# Patient Record
Sex: Female | Born: 1970 | Race: Black or African American | Hispanic: No | Marital: Married | Smoking: Never smoker
Health system: Southern US, Community
[De-identification: ages and names within clinical notes are randomized; demographics above are authoritative.]

## PROBLEM LIST (undated history)

## (undated) DIAGNOSIS — D649 Anemia, unspecified: Secondary | ICD-10-CM

## (undated) DIAGNOSIS — R51 Headache: Secondary | ICD-10-CM

## (undated) DIAGNOSIS — E0500 Thyrotoxicosis with diffuse goiter without thyrotoxic crisis or storm: Secondary | ICD-10-CM

## (undated) DIAGNOSIS — T8859XA Other complications of anesthesia, initial encounter: Secondary | ICD-10-CM

## (undated) DIAGNOSIS — I639 Cerebral infarction, unspecified: Secondary | ICD-10-CM

## (undated) DIAGNOSIS — R519 Headache, unspecified: Secondary | ICD-10-CM

## (undated) DIAGNOSIS — I10 Essential (primary) hypertension: Secondary | ICD-10-CM

## (undated) DIAGNOSIS — T4145XA Adverse effect of unspecified anesthetic, initial encounter: Secondary | ICD-10-CM

## (undated) HISTORY — PX: THYROIDECTOMY: SHX17

---

## 2001-02-10 ENCOUNTER — Emergency Department (HOSPITAL_COMMUNITY): Admission: EM | Admit: 2001-02-10 | Discharge: 2001-02-11 | Payer: Self-pay | Admitting: *Deleted

## 2008-12-23 DIAGNOSIS — I639 Cerebral infarction, unspecified: Secondary | ICD-10-CM

## 2008-12-23 HISTORY — DX: Cerebral infarction, unspecified: I63.9

## 2009-10-17 ENCOUNTER — Emergency Department (HOSPITAL_COMMUNITY): Admission: EM | Admit: 2009-10-17 | Discharge: 2009-10-17 | Payer: Self-pay | Admitting: Emergency Medicine

## 2009-10-24 ENCOUNTER — Emergency Department (HOSPITAL_COMMUNITY): Admission: EM | Admit: 2009-10-24 | Discharge: 2009-10-24 | Payer: Self-pay | Admitting: Emergency Medicine

## 2010-07-11 ENCOUNTER — Emergency Department (HOSPITAL_COMMUNITY): Admission: EM | Admit: 2010-07-11 | Discharge: 2010-07-11 | Payer: Self-pay | Admitting: Emergency Medicine

## 2011-03-09 LAB — POCT I-STAT, CHEM 8
BUN: 12 mg/dL (ref 6–23)
Calcium, Ion: 1.09 mmol/L — ABNORMAL LOW (ref 1.12–1.32)
Creatinine, Ser: 0.8 mg/dL (ref 0.4–1.2)
Hemoglobin: 13.3 g/dL (ref 12.0–15.0)
Sodium: 139 mEq/L (ref 135–145)
TCO2: 25 mmol/L (ref 0–100)

## 2012-01-20 ENCOUNTER — Other Ambulatory Visit (HOSPITAL_COMMUNITY): Payer: Self-pay | Admitting: Internal Medicine

## 2012-01-20 DIAGNOSIS — G8929 Other chronic pain: Secondary | ICD-10-CM

## 2012-01-21 ENCOUNTER — Ambulatory Visit (HOSPITAL_COMMUNITY)
Admission: RE | Admit: 2012-01-21 | Discharge: 2012-01-21 | Disposition: A | Discharge status: Home / Self Care | Payer: Self-pay | Source: Ambulatory Visit | Attending: Internal Medicine | Admitting: Internal Medicine

## 2012-01-21 ENCOUNTER — Encounter (HOSPITAL_COMMUNITY): Payer: Self-pay

## 2012-01-21 DIAGNOSIS — J3489 Other specified disorders of nose and nasal sinuses: Secondary | ICD-10-CM | POA: Insufficient documentation

## 2012-01-21 DIAGNOSIS — G8929 Other chronic pain: Secondary | ICD-10-CM

## 2012-01-21 DIAGNOSIS — R51 Headache: Secondary | ICD-10-CM | POA: Insufficient documentation

## 2012-01-21 DIAGNOSIS — H538 Other visual disturbances: Secondary | ICD-10-CM | POA: Insufficient documentation

## 2012-01-21 DIAGNOSIS — R42 Dizziness and giddiness: Secondary | ICD-10-CM | POA: Insufficient documentation

## 2012-01-21 HISTORY — DX: Essential (primary) hypertension: I10

## 2012-01-21 MED ORDER — IOHEXOL 300 MG/ML  SOLN
100.0000 mL | Freq: Once | INTRAMUSCULAR | Status: AC | PRN
  Administered 2012-01-21: 100 mL via INTRAVENOUS

## 2012-02-19 ENCOUNTER — Other Ambulatory Visit (HOSPITAL_COMMUNITY): Payer: Self-pay | Admitting: Internal Medicine

## 2012-02-19 DIAGNOSIS — R519 Headache, unspecified: Secondary | ICD-10-CM

## 2012-02-26 ENCOUNTER — Ambulatory Visit (HOSPITAL_COMMUNITY)
Admission: RE | Admit: 2012-02-26 | Discharge: 2012-02-26 | Disposition: A | Discharge status: Home / Self Care | Payer: Self-pay | Source: Ambulatory Visit | Attending: Internal Medicine | Admitting: Internal Medicine

## 2012-02-26 DIAGNOSIS — Z8673 Personal history of transient ischemic attack (TIA), and cerebral infarction without residual deficits: Secondary | ICD-10-CM | POA: Insufficient documentation

## 2012-02-26 DIAGNOSIS — R51 Headache: Secondary | ICD-10-CM | POA: Insufficient documentation

## 2012-02-26 DIAGNOSIS — R519 Headache, unspecified: Secondary | ICD-10-CM

## 2012-04-17 ENCOUNTER — Other Ambulatory Visit (HOSPITAL_COMMUNITY): Payer: Self-pay | Admitting: Neurology

## 2012-04-17 DIAGNOSIS — R51 Headache: Secondary | ICD-10-CM

## 2012-04-17 DIAGNOSIS — I679 Cerebrovascular disease, unspecified: Secondary | ICD-10-CM

## 2012-04-17 DIAGNOSIS — I7771 Dissection of carotid artery: Secondary | ICD-10-CM

## 2012-04-20 ENCOUNTER — Ambulatory Visit (HOSPITAL_COMMUNITY): Payer: Self-pay

## 2012-04-24 ENCOUNTER — Other Ambulatory Visit (HOSPITAL_COMMUNITY): Payer: Self-pay | Admitting: Interventional Radiology

## 2012-04-24 ENCOUNTER — Ambulatory Visit (HOSPITAL_COMMUNITY)
Admission: RE | Admit: 2012-04-24 | Discharge: 2012-04-24 | Disposition: A | Discharge status: Home / Self Care | Payer: Self-pay | Source: Ambulatory Visit | Attending: Neurology | Admitting: Neurology

## 2012-04-24 DIAGNOSIS — R51 Headache: Secondary | ICD-10-CM

## 2012-04-24 DIAGNOSIS — R918 Other nonspecific abnormal finding of lung field: Secondary | ICD-10-CM

## 2012-04-24 DIAGNOSIS — G8929 Other chronic pain: Secondary | ICD-10-CM

## 2012-04-24 DIAGNOSIS — I679 Cerebrovascular disease, unspecified: Secondary | ICD-10-CM

## 2012-04-24 DIAGNOSIS — I7771 Dissection of carotid artery: Secondary | ICD-10-CM

## 2012-05-01 ENCOUNTER — Other Ambulatory Visit: Payer: Self-pay | Admitting: Radiology

## 2012-05-08 ENCOUNTER — Other Ambulatory Visit (HOSPITAL_COMMUNITY): Payer: Self-pay | Admitting: Interventional Radiology

## 2012-05-08 ENCOUNTER — Ambulatory Visit (HOSPITAL_COMMUNITY)
Admission: RE | Admit: 2012-05-08 | Discharge: 2012-05-08 | Disposition: A | Discharge status: Home / Self Care | Payer: Self-pay | Source: Ambulatory Visit | Attending: Interventional Radiology | Admitting: Interventional Radiology

## 2012-05-08 ENCOUNTER — Encounter (HOSPITAL_COMMUNITY): Payer: Self-pay

## 2012-05-08 DIAGNOSIS — G8929 Other chronic pain: Secondary | ICD-10-CM

## 2012-05-08 DIAGNOSIS — R918 Other nonspecific abnormal finding of lung field: Secondary | ICD-10-CM

## 2012-05-08 DIAGNOSIS — I6529 Occlusion and stenosis of unspecified carotid artery: Secondary | ICD-10-CM | POA: Insufficient documentation

## 2012-05-08 DIAGNOSIS — Z8673 Personal history of transient ischemic attack (TIA), and cerebral infarction without residual deficits: Secondary | ICD-10-CM | POA: Insufficient documentation

## 2012-05-08 DIAGNOSIS — R51 Headache: Secondary | ICD-10-CM | POA: Insufficient documentation

## 2012-05-08 HISTORY — DX: Thyrotoxicosis with diffuse goiter without thyrotoxic crisis or storm: E05.00

## 2012-05-08 LAB — APTT: aPTT: 30 seconds (ref 24–37)

## 2012-05-08 LAB — PROTIME-INR: INR: 0.87 (ref 0.00–1.49)

## 2012-05-08 LAB — CBC
HCT: 32 % — ABNORMAL LOW (ref 36.0–46.0)
Hemoglobin: 10.2 g/dL — ABNORMAL LOW (ref 12.0–15.0)
MCHC: 31.9 g/dL (ref 30.0–36.0)
RDW: 18.4 % — ABNORMAL HIGH (ref 11.5–15.5)
WBC: 6.8 10*3/uL (ref 4.0–10.5)

## 2012-05-08 LAB — BASIC METABOLIC PANEL
BUN: 13 mg/dL (ref 6–23)
Chloride: 100 mEq/L (ref 96–112)
Creatinine, Ser: 0.79 mg/dL (ref 0.50–1.10)
GFR calc Af Amer: >90 mL/min (ref >90)
GFR calc non Af Amer: >90 mL/min (ref >90)
Potassium: 5.1 mEq/L (ref 3.5–5.1)

## 2012-05-08 LAB — DIFFERENTIAL
Basophils Absolute: 0.1 10*3/uL (ref 0.0–0.1)
Basophils Relative: 1 % (ref 0–1)
Lymphocytes Relative: 35 % (ref 12–46)
Monocytes Absolute: 0.5 10*3/uL (ref 0.1–1.0)
Neutro Abs: 3.7 10*3/uL (ref 1.7–7.7)
Neutrophils Relative %: 55 % (ref 43–77)

## 2012-05-08 MED ORDER — HEPARIN SOD (PORK) LOCK FLUSH 100 UNIT/ML IV SOLN
INTRAVENOUS | Status: AC | PRN
Start: 2012-05-08 — End: 2012-05-08
  Administered 2012-05-08 (×2): 500 [IU] via INTRAVENOUS

## 2012-05-08 MED ORDER — FENTANYL CITRATE 0.05 MG/ML IJ SOLN
INTRAMUSCULAR | Status: AC | PRN
  Administered 2012-05-08: 25 ug via INTRAVENOUS

## 2012-05-08 MED ORDER — IOHEXOL 300 MG/ML  SOLN
100.0000 mL | Freq: Once | INTRAMUSCULAR | Status: AC | PRN
  Administered 2012-05-08: 70 mL via INTRA_ARTERIAL

## 2012-05-08 MED ORDER — FENTANYL CITRATE 0.05 MG/ML IJ SOLN
INTRAMUSCULAR | Status: AC
  Filled 2012-05-08: qty 4

## 2012-05-08 MED ORDER — HYDRALAZINE HCL 20 MG/ML IJ SOLN
INTRAMUSCULAR | Status: AC | PRN
  Administered 2012-05-08: 5 mg via INTRAVENOUS

## 2012-05-08 MED ORDER — SODIUM CHLORIDE 0.9 % IV SOLN
Freq: Once | INTRAVENOUS | Status: AC
  Administered 2012-05-08: 08:00:00 via INTRAVENOUS

## 2012-05-08 MED ORDER — SODIUM CHLORIDE 0.9 % IV SOLN: INTRAVENOUS | Status: AC

## 2012-05-08 MED ORDER — MIDAZOLAM HCL 5 MG/5ML IJ SOLN
INTRAMUSCULAR | Status: AC | PRN
  Administered 2012-05-08: 1 mg via INTRAVENOUS

## 2012-05-08 MED ORDER — MIDAZOLAM HCL 2 MG/2ML IJ SOLN
INTRAMUSCULAR | Status: AC
  Filled 2012-05-08: qty 6

## 2012-05-08 MED ORDER — HYDRALAZINE HCL 20 MG/ML IJ SOLN
INTRAMUSCULAR | Status: AC
  Filled 2012-05-08: qty 1

## 2012-05-08 NOTE — Procedures (Signed)
S/P 4 vessel cerebral arteriogram RT CFA approach. Preliminary findings. 1. Bilaerally occuded  ICAs with collateralization from both VAs via the the Pcoms.

## 2012-05-08 NOTE — H&P (Signed)
Chief Complaint: Headaches, possible carotid stenosis HPI: Holly Hamilton is an 41 y.o. female who has been seen by Dr. Corliss Skains fro abnormal MRA findings and is scheduled for formal 4-vessel cerebral arteriogram.   Past Medical History:  Past Medical History  Diagnosis Date  . Hypertension   . Grave's disease     Past Surgical History:  Past Surgical History  Procedure Date  . Thyroidectomy     Family History: History reviewed. No pertinent family history.  Social History:  reports that she has never smoked. She does not have any smokeless tobacco history on file. She reports that she does not drink alcohol or use illicit drugs.  Allergies: No Known Allergies  Medications: Synthroid 150 mcg 1 tablet daily,  lisinopril/hydrochlorothiazide 10/12.5 mg 1 tablet daily, Percocet  10/325 mg 1-2 tablets every 4-6 hours as needed, aspirin 325 mg  daily.   Please HPI for pertinent positives, otherwise complete 10 system ROS negative.  Physical Exam: Blood pressure 123/83, pulse 86, temperature 97.9 F (36.6 C), temperature source Oral, resp. rate 16, height 5\' 4"  (1.626 m), weight 188 lb (85.276 kg), SpO2 100.00%. Body mass index is 32.27 kg/(m^2).   General Appearance:  Alert, cooperative, no distress, appears stated age  Head:  Normocephalic, without obvious abnormality, atraumatic  ENT: Unremarkable  Neck: Supple, symmetrical, trachea midline, no adenopathy, thyroid: not enlarged, symmetric, no tenderness/mass/nodules  Lungs:   Clear to auscultation bilaterally, no w/r/r, respirations unlabored without use of accessory muscles.  Chest Wall:  No tenderness or deformity  Heart:  Regular rate and rhythm, S1, S2 normal, no murmur, rub or gallop. Carotids 2+ without bruit.  Abdomen:   Soft, non-tender, non distended. Bowel sounds active all four quadrants,  no masses, no organomegaly.  Extremities: Extremities normal, atraumatic, no cyanosis or edema  Pulses: 2+ and symmetric  Skin:  Skin color, texture, turgor normal, no rashes or lesions  Neurologic: Normal affect, no gross deficits.   Results for orders placed during the hospital encounter of 05/08/12 (from the past 48 hour(s))  APTT     Status: Normal   Collection Time   05/08/12  8:01 AM      Component Value Range Comment   aPTT 30  24 - 37 (seconds)   CBC     Status: Abnormal   Collection Time   05/08/12  8:01 AM      Component Value Range Comment   WBC 6.8  4.0 - 10.5 (K/uL)    RBC 4.42  3.87 - 5.11 (MIL/uL)    Hemoglobin 10.2 (*) 12.0 - 15.0 (g/dL)    HCT 45.4 (*) 09.8 - 46.0 (%)    MCV 72.4 (*) 78.0 - 100.0 (fL)    MCH 23.1 (*) 26.0 - 34.0 (pg)    MCHC 31.9  30.0 - 36.0 (g/dL)    RDW 11.9 (*) 14.7 - 15.5 (%)    Platelets 310  150 - 400 (K/uL)   DIFFERENTIAL     Status: Normal   Collection Time   05/08/12  8:01 AM      Component Value Range Comment   Neutrophils Relative 55  43 - 77 (%)    Neutro Abs 3.7  1.7 - 7.7 (K/uL)    Lymphocytes Relative 35  12 - 46 (%)    Lymphs Abs 2.4  0.7 - 4.0 (K/uL)    Monocytes Relative 8  3 - 12 (%)    Monocytes Absolute 0.5  0.1 - 1.0 (K/uL)    Eosinophils  Relative 1  0 - 5 (%)    Eosinophils Absolute 0.1  0.0 - 0.7 (K/uL)    Basophils Relative 1  0 - 1 (%)    Basophils Absolute 0.1  0.0 - 0.1 (K/uL)   PROTIME-INR     Status: Normal   Collection Time   05/08/12  8:01 AM      Component Value Range Comment   Prothrombin Time 12.0  11.6 - 15.2 (seconds)    INR 0.87  0.00 - 1.49    BASIC METABOLIC PANEL     Status: Normal (Preliminary result)   Collection Time   05/08/12  8:10 AM      Component Value Range Comment   Sodium 135  135 - 145 (mEq/L)    Potassium PENDING  3.5 - 5.1 (mEq/L)    Chloride 100  96 - 112 (mEq/L)    CO2 24  19 - 32 (mEq/L)    Glucose, Bld 89  70 - 99 (mg/dL)    BUN 13  6 - 23 (mg/dL)    Creatinine, Ser 2.84  0.50 - 1.10 (mg/dL)    Calcium 9.4  8.4 - 10.5 (mg/dL)    GFR calc non Af Amer >90  >90 (mL/min)    GFR calc Af Amer >90  >90  (mL/min)    No results found.  Assessment/Plan Headaches, abnormal MRA findings. Proceed with cerebral angio today. Procedure reviewed including risks. Labs ok. Consent signed in chart.  Brayton El PA-C 05/08/2012, 9:08 AM

## 2012-05-08 NOTE — Progress Notes (Signed)
Removed right groin dressing and placed bandaid to site.  Site WNL.  Orthostatic vs done and pt tolerated well.  Ambulated pt in hallway and pt tolerated well.  Reviewed D/C instructions with pt and family and both verbalized understanding.  Pt D/C home with family via wheelchair.

## 2012-05-08 NOTE — Discharge Instructions (Signed)

## 2012-05-11 ENCOUNTER — Telehealth (HOSPITAL_COMMUNITY): Payer: Self-pay | Admitting: *Deleted

## 2012-05-11 NOTE — Telephone Encounter (Signed)
Post op phone call, right groin a little sore but no signs of hematoma or bruising per pt.  Feels good.  Encouraged to call for any problems or questions.

## 2012-07-21 ENCOUNTER — Telehealth (HOSPITAL_COMMUNITY): Payer: Self-pay

## 2012-07-21 NOTE — Telephone Encounter (Signed)
I left a vm for Holly Hamilton in regards to calling the office to schedule her f/u

## 2014-02-04 IMAGING — XA IR ANGIO INTRA EXTRACRAN SEL COM CAROTID INNOMINATE BILAT MOD SE
1 series · 12 of 24 positions shown · IV contrast (IODINE)
Comparison: MRI of the brain of 02/26/2012.

CLINICAL DATA: Patient with recurrent headaches.  Previous history
of right cerebral hemispheric ischemic strokes.  Abnormal MRI of
the brain and MRA of the brain.

BILATERAL COMMON CAROTID ARTERY AND BILATERAL VERTEBRAL ARTERY
ANGIOGRAMS

[Series 300: neuro · 12 of 201 slices shown]
[im 9/201]
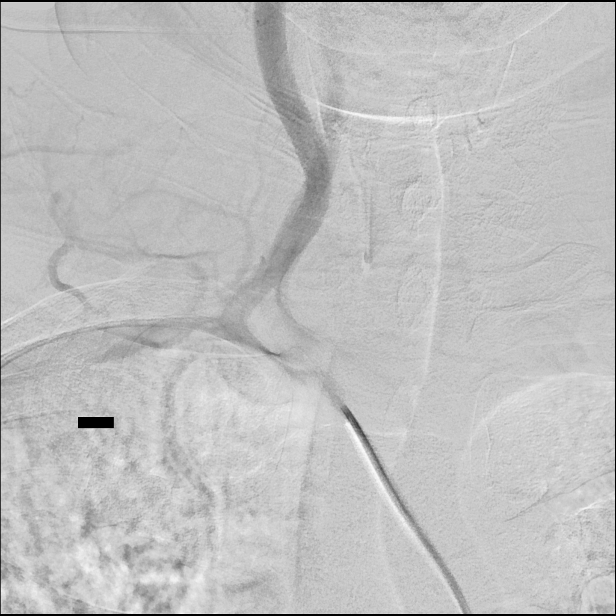
[im 27/201]
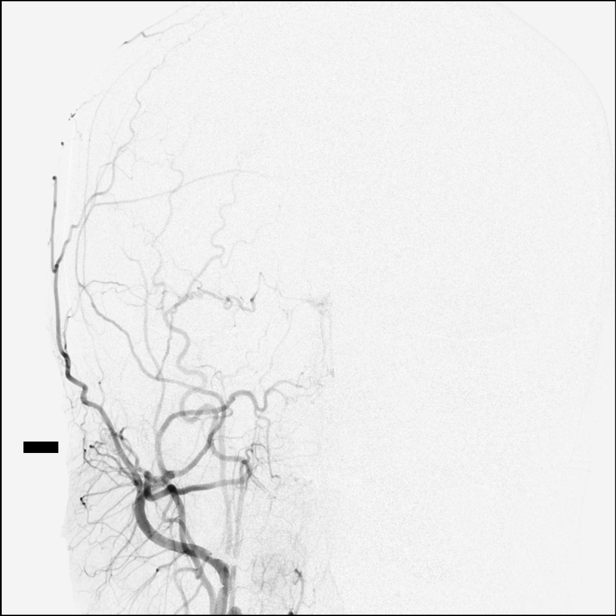
[im 44/201]
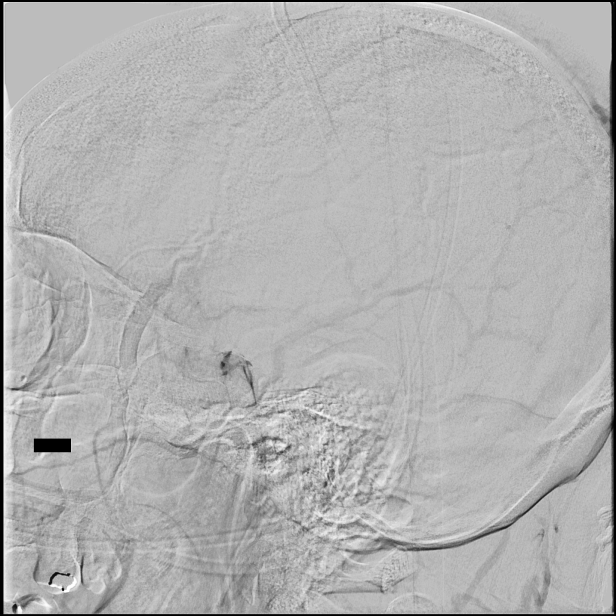
[im 61/201]
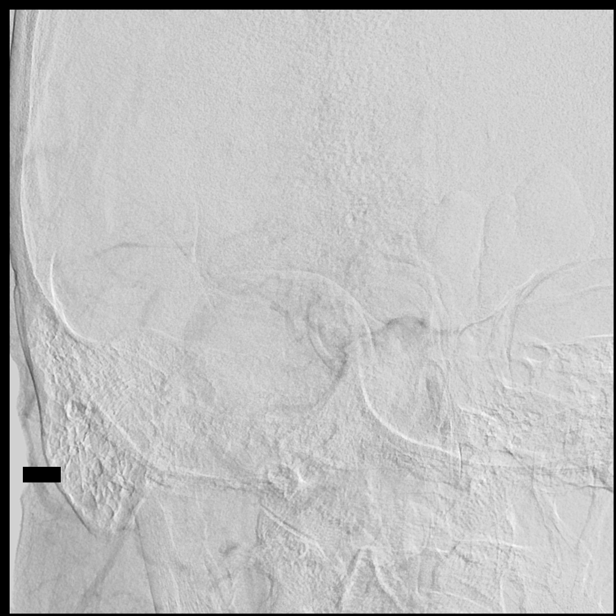
[im 79/201]
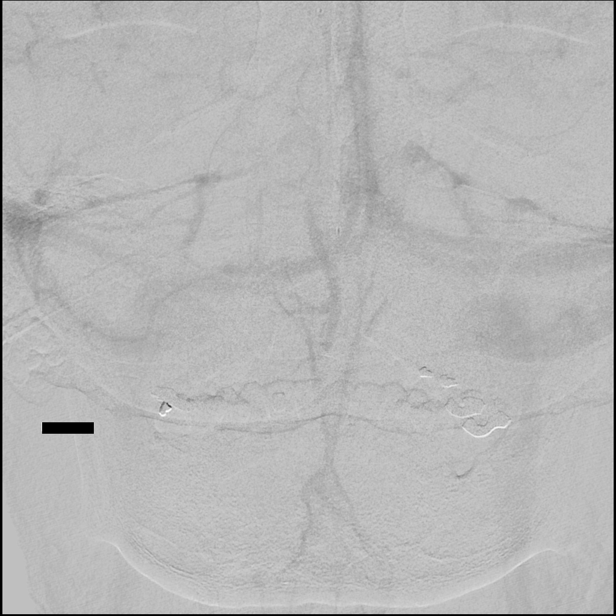
[im 96/201]
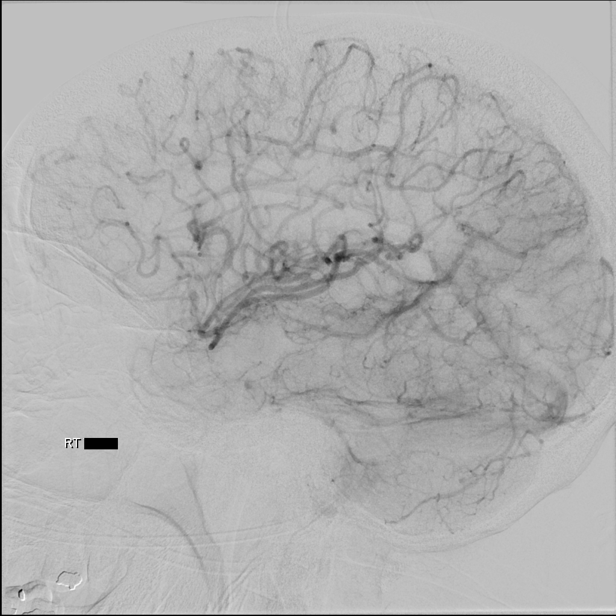
[im 114/201]
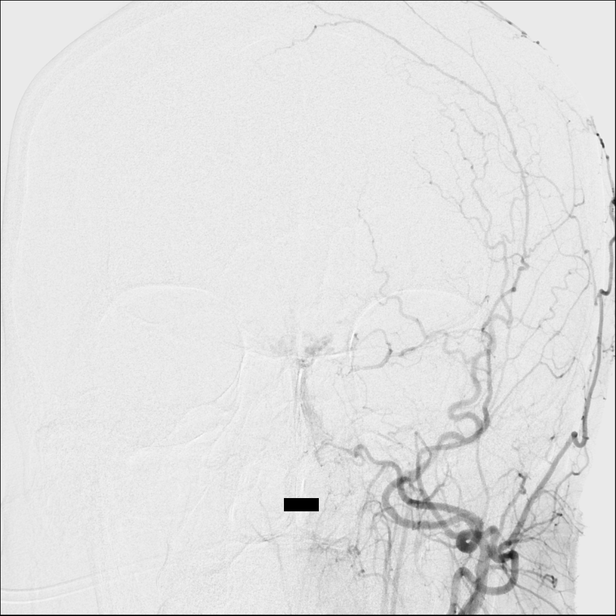
[im 131/201]
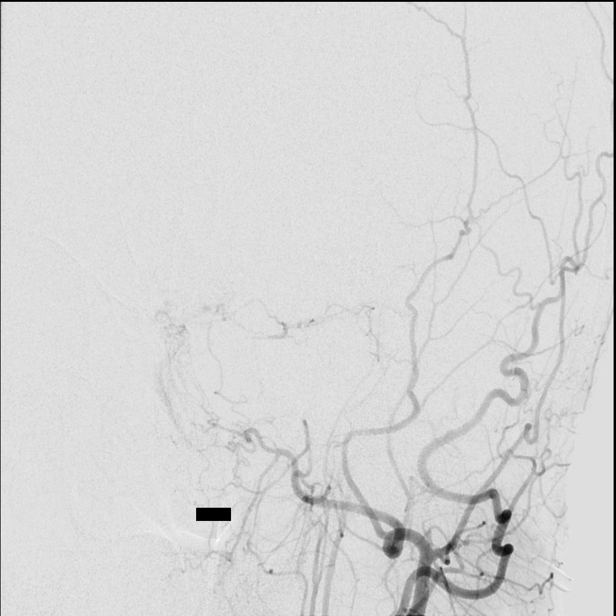
[im 148/201]
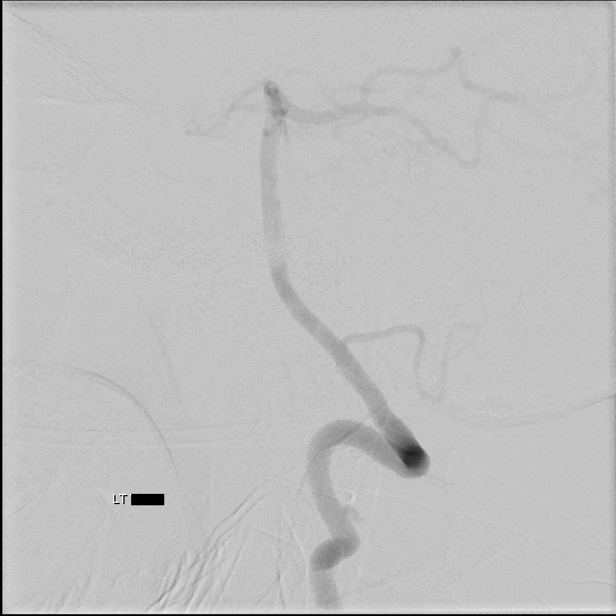
[im 166/201]
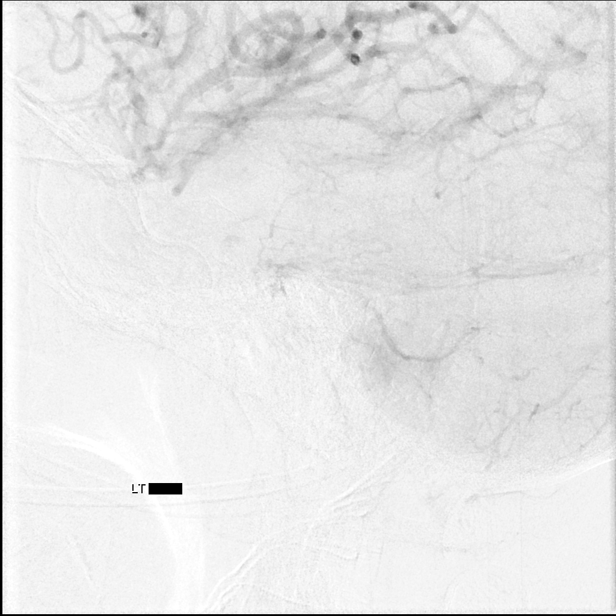
[im 183/201]
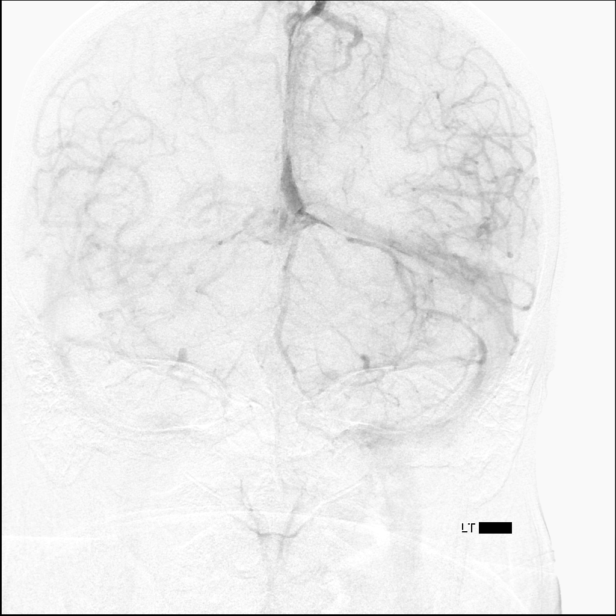
[im 201/201]
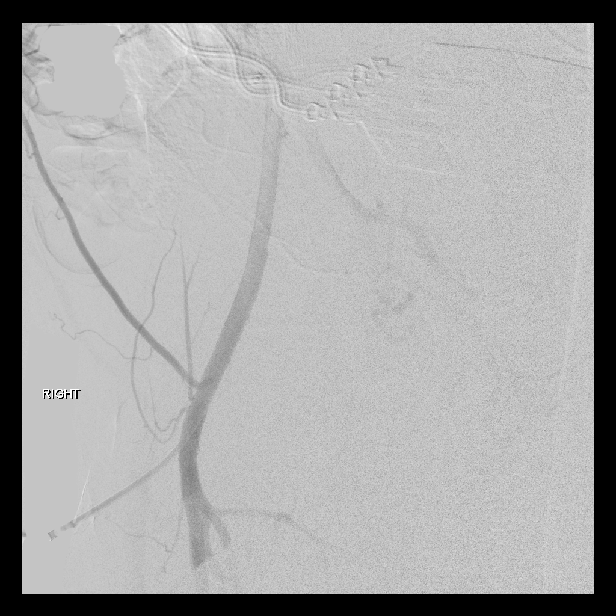

[12 of 24 positions shown; findings below may reference images not displayed]

Following a full explanation of the procedure along with the
potential associated complications, an informed witnessed consent
was obtained.

The right groin was prepped and draped in the usual sterile
fashion.  Thereafter using a modified Seldinger technique,
transfemoral access into the right common femoral artery was
obtained without difficulty.

Over a 0.035-inch guidewire, a 5-French Pinnacle sheath was
inserted.  Through this and also over a 0.035-inch guidewire, a 5-
French JB1 catheter was advanced to the aortic arch region and
selectively positioned in the right common carotid artery, the
right vertebral artery, the left common carotid artery and the left
vertebral artery.

There were no acute complications. The patient tolerated the
procedure well.

Medications utilized: Versed 1 mg IV.  Fentanyl 25 mg IV.

Contrast: Dmnipaque-V22 approximately 55 ml.
FINDINGS: The right common carotid arteriogram demonstrates the right
external carotid artery and its major branches to be normal.

The right internal carotid artery at the bulb is patent.  The
vessel distal to this is diffusely significantly decreased in
caliber to the cavernous segment where it demonstrates patchy
opacification to the level of the ophthalmic artery.

A pituitary blush is seen.

There is no flow noted within the distal cavernous carotid or
supraclinoid right ICA.

Extensive nasoseptal collaterals are seen opacifying retrogradely
the ophthalmic artery.

The right vertebral artery origin is normal.  The vessel opacifies
normally to the cranial skull base.  There is normal opacification
of the right posterior-inferior cerebral artery.

The basilar artery, the posterior cerebral arteries, the superior
cerebellar arteries and the anterior-inferior cerebellar arteries
opacify normally into the capillary and venous phases.

Extensive leptomeningeal collateralization from both the P3
segments of the posterior cerebral arteries, and retrogradely via
the posterior communicating arteries are seen to supply the
parietal, the posterior frontal and then the anterior frontal
regions in a retrograde manner.

Retrograde opacification of the middle cerebral arteries to their
sites of occlusion in the distal M1 segment is noted.

The left common carotid arteriogram demonstrates the left external
carotid artery and its major branches to be normal.

The left internal carotid artery just distal to the bulb shows
diffuse decreased caliber to the level of the cavernous segment
where there is complete occlusion.

Extensive retrograde opacification of the ipsilateral ophthalmic
artery is seen from the nasoseptal branches of the external carotid
artery. The supraclinoid segment remains occluded.  No intracranial
flow is demonstrated from the carotid system.

The left vertebral artery origin is normal.  The vessel opacifies
normally to the cranial skull base.

There is normal opacification of the left posterior-inferior
cerebellar artery, and the left vertebrobasilar junction.

The basilar artery, the posterior cerebral arteries, superior
cerebellar arteries and the anterior-inferior cerebellar arteries
opacify normally into the capillary and venous phases.

Again demonstrated is retrograde opacification via the anterior
communicating arteries of the anterior cerebrals and the middle
cerebral artery distributions.

Also noted are extensive leptomeningeal collaterals from the P3
segments of the posterior cerebral arteries with retrograde flow
into the parietal, anterior parietal and the frontal cortical
regions and then subsequently into the subcortical regions and also
the MCA trifurcation regions bilaterally.

IMPRESSION
1.  Angiographically complete occlusion of the internal carotid
arteries in the cavernous segments.
2.  Angiographic extensive collateralization via the posterior
communicating arteries, and via the leptomeningeal collaterals from
the posterior cerebral arteries bilaterally supplying the anterior
and middle cerebral artery distributions bilaterally as described
above.

The angiographic findings were reviewed with the patient and the
patient's family.  The patient was strongly advised to stop
smoking, refrain from use of vasoconstrictor agents such as cocaine
or marijuana.  She has also been strongly advised to continue
taking her antihypertensive medications.  A follow-up CT perfusion
scan will be performed in 3 months from today.

## 2017-01-22 ENCOUNTER — Emergency Department (HOSPITAL_COMMUNITY)
Admission: EM | Admit: 2017-01-22 | Discharge: 2017-01-22 | Disposition: A | Discharge status: Home / Self Care | Payer: Self-pay | Attending: Emergency Medicine | Admitting: Emergency Medicine

## 2017-01-22 ENCOUNTER — Encounter (HOSPITAL_COMMUNITY): Payer: Self-pay | Admitting: Emergency Medicine

## 2017-01-22 DIAGNOSIS — R197 Diarrhea, unspecified: Secondary | ICD-10-CM | POA: Insufficient documentation

## 2017-01-22 DIAGNOSIS — J029 Acute pharyngitis, unspecified: Secondary | ICD-10-CM | POA: Insufficient documentation

## 2017-01-22 DIAGNOSIS — R6889 Other general symptoms and signs: Secondary | ICD-10-CM

## 2017-01-22 DIAGNOSIS — Z7982 Long term (current) use of aspirin: Secondary | ICD-10-CM | POA: Insufficient documentation

## 2017-01-22 DIAGNOSIS — Z79899 Other long term (current) drug therapy: Secondary | ICD-10-CM | POA: Insufficient documentation

## 2017-01-22 DIAGNOSIS — I10 Essential (primary) hypertension: Secondary | ICD-10-CM | POA: Insufficient documentation

## 2017-01-22 MED ORDER — OXYMETAZOLINE HCL 0.05 % NA SOLN
1.0000 | Freq: Two times a day (BID) | NASAL | 0 refills | Status: DC
Start: 2017-01-22 — End: 2017-06-20

## 2017-01-22 MED ORDER — BENZONATATE 100 MG PO CAPS: 200.0000 mg | ORAL_CAPSULE | Freq: Two times a day (BID) | ORAL | 0 refills | Status: DC | PRN

## 2017-01-22 NOTE — ED Notes (Signed)
See PA note for secondary assessment.   

## 2017-01-22 NOTE — ED Triage Notes (Signed)
Has felt hot cold and has had a cough  For a couple of days  Felt lightheaded at work

## 2017-01-22 NOTE — ED Provider Notes (Signed)
[facility] DEPT Provider Note   CSN: [id number] Arrival date & time: 01/22/17  1344  By signing my name below, I, Higinio Plan, attest that this documentation has been prepared under the direction and in the presence of Harlene Ramus, PA-C . Electronically Signed: Higinio Plan, Scribe. 01/22/2017. 2:19 PM.  History   Chief Complaint Chief Complaint  Patient presents with  . Chills  . Cough   The history is provided by the patient. No language interpreter was used.   HPI Comments: Holly Hamilton is a 46 y.o. female with PMHx of HTN, hyperthyroidism, and Grave's Disease, who presents to the Emergency Department complaining of gradually worsening, sore throat and yellow mucous that began ~4 days ago. Pt reports associated chills, subjective fever, headache which she describes as sinus pressure, congestion, rhinorrhea, lightheadedness, shortness of breath, and loose stools. She notes her husband had similar symptoms including nausea and vomiting last week that he "treated by himself." She states she did not receive the influenza vaccination this year. Pt denies chest pain, wheezing, abdominal pain, vomiting, blood in her stool, wheezing, rash and dysuria.  Past Medical History:  Diagnosis Date  . Grave's disease   . Hypertension    There are no active problems to display for this patient.  Past Surgical History:  Procedure Laterality Date  . THYROIDECTOMY      OB History    No data available     Home Medications    Prior to Admission medications   Medication Sig Start Date End Date Taking? Authorizing Provider  aspirin EC 325 MG tablet Take 325 mg by mouth daily.    Historical Provider, MD  aspirin-acetaminophen-caffeine (EXCEDRIN MIGRAINE) [phone] MG per tablet Take 1 tablet by mouth every 6 (six) hours as needed. For migraines    Historical Provider, MD  benzonatate (TESSALON) 100 MG capsule Take 2 capsules (200 mg total) by mouth 2 (two) times daily as needed for cough.  01/22/17   Nona Dell, PA-C  Cholecalciferol (VITAMIN D PO) Take by mouth.    Historical Provider, MD  levothyroxine (SYNTHROID, LEVOTHROID) 150 MCG tablet Take 150 mcg by mouth daily.    Historical Provider, MD  lisinopril-hydrochlorothiazide (PRINZIDE,ZESTORETIC) 10-12.5 MG per tablet Take 1 tablet by mouth daily.    Historical Provider, MD  oxyCODONE-acetaminophen (PERCOCET) 10-325 MG per tablet Take 1 tablet by mouth every 4 (four) hours as needed. For pain    Historical Provider, MD  oxymetazoline (AFRIN NASAL SPRAY) 0.05 % nasal spray Place 1 spray into both nostrils 2 (two) times daily. Spray once into each nostril twice daily for up to the next 3 days. Do not use for more than 3 days to prevent rebound rhinorrhea. 01/22/17   Nona Dell, PA-C    Family History No family history on file.  Social History Social History  Substance Use Topics  . Smoking status: Never Smoker  . Smokeless tobacco: Not on file  . Alcohol use No     Allergies   Patient has no known allergies.   Review of Systems Review of Systems  Constitutional: Positive for chills and fever.  HENT: Positive for congestion, rhinorrhea, sinus pain, sinus pressure and sore throat. Negative for ear pain.   Respiratory: Positive for cough and shortness of breath. Negative for wheezing.   Gastrointestinal: Positive for diarrhea. Negative for abdominal pain, blood in stool and vomiting.  Genitourinary: Negative for dysuria.  Neurological: Positive for light-headedness.   Physical Exam Updated Vital Signs BP 114/69 (BP Location:  Left Arm)   Pulse 79   Temp 98.5 F (36.9 C) (Oral)   Resp 16   SpO2 100%   Physical Exam  Constitutional: She is oriented to person, place, and time. She appears well-developed and well-nourished.  HENT:  Head: Normocephalic and atraumatic.  Right Ear: Tympanic membrane normal.  Left Ear: Tympanic membrane normal.  Nose: Rhinorrhea present. Right sinus  exhibits no maxillary sinus tenderness and no frontal sinus tenderness. Left sinus exhibits no maxillary sinus tenderness and no frontal sinus tenderness.  Mouth/Throat: Uvula is midline, oropharynx is clear and moist and mucous membranes are normal. No oropharyngeal exudate, posterior oropharyngeal edema, posterior oropharyngeal erythema or tonsillar abscesses.  Eyes: Conjunctivae and EOM are normal. Right eye exhibits no discharge. Left eye exhibits no discharge. No scleral icterus.  Neck: Normal range of motion. Neck supple.  Cardiovascular: Normal rate, regular rhythm, normal heart sounds and intact distal pulses.   Pulmonary/Chest: Effort normal and breath sounds normal. No respiratory distress. She has no wheezes. She has no rales. She exhibits no tenderness.  Abdominal: Soft. Bowel sounds are normal. She exhibits no distension and no mass. There is no tenderness. There is no rebound and no guarding. No hernia.  Musculoskeletal: She exhibits no edema.  Lymphadenopathy:    She has no cervical adenopathy.  Neurological: She is alert and oriented to person, place, and time.  Skin: Skin is warm and dry.  Nursing note and vitals reviewed.  ED Treatments / Results  Labs (all labs ordered are listed, but only abnormal results are displayed) Labs Reviewed - No data to display  EKG  EKG Interpretation None       Radiology No results found.  Procedures Procedures (including critical care time)  Medications Ordered in ED Medications - No data to display  DIAGNOSTIC STUDIES:  Oxygen Saturation is 100% on RA, normal by my interpretation.    COORDINATION OF CARE:  2:19 PM Discussed treatment plan with pt at bedside and pt agreed to plan.  Initial Impression / Assessment and Plan / ED Course  I have reviewed the triage vital signs and the nursing notes.  Pertinent labs & imaging results that were available during my care of the patient were reviewed by me and considered in my  medical decision making (see chart for details).     Patient with symptoms consistent with influenza.  Vitals are stable, low-grade fever.  No signs of dehydration, tolerating PO's.  Lungs are clear. Due to patient's presentation and physical exam a chest x-ray was not ordered bc likely diagnosis of flu.  Discussed the cost versus benefit of Tamiflu treatment with the patient.  The patient understands that symptoms are greater than the recommended 24-48 hour window of treatment.  Patient will be discharged with instructions to orally hydrate, rest, and use over-the-counter medications such as anti-inflammatories ibuprofen and Aleve for muscle aches and Tylenol for fever.  Patient will also be given a cough suppressant and decongestant.    I personally performed the services described in this documentation, which was scribed in my presence. The recorded information has been reviewed and is accurate.   Final Clinical Impressions(s) / ED Diagnoses   Final diagnoses:  Flu-like symptoms    New Prescriptions New Prescriptions   BENZONATATE (TESSALON) 100 MG CAPSULE    Take 2 capsules (200 mg total) by mouth 2 (two) times daily as needed for cough.   OXYMETAZOLINE (AFRIN NASAL SPRAY) 0.05 % NASAL SPRAY    Place 1 spray into  both nostrils 2 (two) times daily. Spray once into each nostril twice daily for up to the next 3 days. Do not use for more than 3 days to prevent rebound rhinorrhea.     Chesley Noon [place], [place] 01/22/17 [place] Yao, MD 01/22/17 [phone]

## 2017-01-22 NOTE — Discharge Instructions (Signed)
Take your medications as prescribed. I also recommend taking Tylenol and ibuprofen as prescribed over-the-counter, oximetry in between doses every 3-4 hours. Continue drinking fluids at home to remain hydrated. I recommend eating a bland diet for the next few days and taper symptoms have improved. Follow-up with your primary care provider in the next 3-4 days if symptoms have not improved. Return to the emergency department if symptoms worsen or new onset of headache, neck stiffness, difficulty breathing, coughing up blood, chest pain, abdominal pain, vomiting, unable to keep fluids down.

## 2017-06-12 ENCOUNTER — Other Ambulatory Visit: Payer: Self-pay | Admitting: Obstetrics and Gynecology

## 2017-06-12 DIAGNOSIS — Z1231 Encounter for screening mammogram for malignant neoplasm of breast: Secondary | ICD-10-CM

## 2017-06-19 ENCOUNTER — Encounter (HOSPITAL_COMMUNITY): Payer: Self-pay

## 2017-06-19 ENCOUNTER — Observation Stay (HOSPITAL_COMMUNITY): Payer: Self-pay

## 2017-06-19 ENCOUNTER — Observation Stay: Payer: Self-pay

## 2017-06-19 ENCOUNTER — Ambulatory Visit (HOSPITAL_COMMUNITY): Payer: Self-pay

## 2017-06-19 ENCOUNTER — Observation Stay (HOSPITAL_COMMUNITY)
Admission: EM | Admit: 2017-06-19 | Discharge: 2017-06-20 | Disposition: A | Discharge status: Still In Hospital | Payer: Self-pay | Attending: Obstetrics & Gynecology | Admitting: Obstetrics & Gynecology

## 2017-06-19 DIAGNOSIS — E890 Postprocedural hypothyroidism: Secondary | ICD-10-CM | POA: Insufficient documentation

## 2017-06-19 DIAGNOSIS — D219 Benign neoplasm of connective and other soft tissue, unspecified: Secondary | ICD-10-CM

## 2017-06-19 DIAGNOSIS — I10 Essential (primary) hypertension: Secondary | ICD-10-CM | POA: Insufficient documentation

## 2017-06-19 DIAGNOSIS — R011 Cardiac murmur, unspecified: Secondary | ICD-10-CM | POA: Insufficient documentation

## 2017-06-19 DIAGNOSIS — D250 Submucous leiomyoma of uterus: Secondary | ICD-10-CM

## 2017-06-19 DIAGNOSIS — D649 Anemia, unspecified: Secondary | ICD-10-CM

## 2017-06-19 DIAGNOSIS — D252 Subserosal leiomyoma of uterus: Secondary | ICD-10-CM | POA: Insufficient documentation

## 2017-06-19 DIAGNOSIS — R55 Syncope and collapse: Secondary | ICD-10-CM

## 2017-06-19 DIAGNOSIS — D251 Intramural leiomyoma of uterus: Secondary | ICD-10-CM

## 2017-06-19 DIAGNOSIS — Z79899 Other long term (current) drug therapy: Secondary | ICD-10-CM | POA: Insufficient documentation

## 2017-06-19 DIAGNOSIS — N939 Abnormal uterine and vaginal bleeding, unspecified: Secondary | ICD-10-CM

## 2017-06-19 DIAGNOSIS — N938 Other specified abnormal uterine and vaginal bleeding: Principal | ICD-10-CM | POA: Insufficient documentation

## 2017-06-19 HISTORY — DX: Anemia, unspecified: D64.9

## 2017-06-19 LAB — CBC WITH DIFFERENTIAL/PLATELET
BASOS ABS: 0 10*3/uL (ref 0.0–0.1)
Basophils Absolute: 0 10*3/uL (ref 0.0–0.1)
Basophils Relative: 0 %
Basophils Relative: 0 %
EOS PCT: 1 %
EOS PCT: 0 %
Eosinophils Absolute: 0.1 10*3/uL (ref 0.0–0.7)
Eosinophils Absolute: 0 10*3/uL (ref 0.0–0.7)
HCT: 16.9 % — ABNORMAL LOW (ref 36.0–46.0)
HCT: 20.4 % — ABNORMAL LOW (ref 36.0–46.0)
HEMOGLOBIN: 4.4 g/dL — AB (ref 12.0–15.0)
HEMOGLOBIN: 5.8 g/dL — AB (ref 12.0–15.0)
LYMPHS PCT: 14 %
Lymphocytes Relative: 23 %
Lymphs Abs: 1.3 10*3/uL (ref 0.7–4.0)
Lymphs Abs: 2.4 10*3/uL (ref 0.7–4.0)
MCH: 15.6 pg — AB (ref 26.0–34.0)
MCH: 18.1 pg — ABNORMAL LOW (ref 26.0–34.0)
MCHC: 26 g/dL — ABNORMAL LOW (ref 30.0–36.0)
MCHC: 28.4 g/dL — ABNORMAL LOW (ref 30.0–36.0)
MCV: 59.9 fL — ABNORMAL LOW (ref 78.0–100.0)
MCV: 63.6 fL — AB (ref 78.0–100.0)
Monocytes Absolute: 0.5 10*3/uL (ref 0.1–1.0)
Monocytes Absolute: 0.3 10*3/uL (ref 0.1–1.0)
Monocytes Relative: 5 %
Monocytes Relative: 3 %
NEUTROS PCT: 80 %
NEUTROS PCT: 73 %
Neutro Abs: 7.4 10*3/uL (ref 1.7–7.7)
Neutro Abs: 7.5 10*3/uL (ref 1.7–7.7)
PLATELETS: 833 10*3/uL — AB (ref 150–400)
PLATELETS: 850 10*3/uL — AB (ref 150–400)
RBC: 2.82 MIL/uL — AB (ref 3.87–5.11)
RBC: 3.21 MIL/uL — AB (ref 3.87–5.11)
RDW: 29.7 % — ABNORMAL HIGH (ref 11.5–15.5)
RDW: 32.5 % — ABNORMAL HIGH (ref 11.5–15.5)
WBC: 9.3 10*3/uL (ref 4.0–10.5)
WBC: 10.3 10*3/uL (ref 4.0–10.5)

## 2017-06-19 LAB — T4, FREE: FREE T4: 1.35 ng/dL — AB (ref 0.61–1.12)

## 2017-06-19 LAB — COMPREHENSIVE METABOLIC PANEL
ALT: 15 U/L (ref 14–54)
ANION GAP: 9 (ref 5–15)
AST: 27 U/L (ref 15–41)
Albumin: 3.1 g/dL — ABNORMAL LOW (ref 3.5–5.0)
Alkaline Phosphatase: 56 U/L (ref 38–126)
BUN: 14 mg/dL (ref 6–20)
CHLORIDE: 104 mmol/L (ref 101–111)
CO2: 24 mmol/L (ref 22–32)
Calcium: 8.5 mg/dL — ABNORMAL LOW (ref 8.9–10.3)
Creatinine, Ser: 0.83 mg/dL (ref 0.44–1.00)
Glucose, Bld: 107 mg/dL — ABNORMAL HIGH (ref 65–99)
POTASSIUM: 3.1 mmol/L — AB (ref 3.5–5.1)
SODIUM: 137 mmol/L (ref 135–145)
Total Bilirubin: 0.2 mg/dL — ABNORMAL LOW (ref 0.3–1.2)
Total Protein: 6.9 g/dL (ref 6.5–8.1)

## 2017-06-19 LAB — CBC
HEMATOCRIT: 24.4 % — AB (ref 36.0–46.0)
HEMOGLOBIN: 7.5 g/dL — AB (ref 12.0–15.0)
MCH: 20.6 pg — AB (ref 26.0–34.0)
MCHC: 30.7 g/dL (ref 30.0–36.0)
MCV: 67 fL — ABNORMAL LOW (ref 78.0–100.0)
Platelets: 757 10*3/uL — ABNORMAL HIGH (ref 150–400)
RBC: 3.64 MIL/uL — ABNORMAL LOW (ref 3.87–5.11)
RDW: 29.8 % — ABNORMAL HIGH (ref 11.5–15.5)
WBC: 8.4 10*3/uL (ref 4.0–10.5)

## 2017-06-19 LAB — ABO/RH
ABO/RH(D): O POS
ABO/RH(D): O POS

## 2017-06-19 LAB — PREPARE RBC (CROSSMATCH)

## 2017-06-19 LAB — TSH: TSH: 0.224 u[IU]/mL — AB (ref 0.350–4.500)

## 2017-06-19 MED ORDER — SODIUM CHLORIDE 0.9 % IV SOLN
Freq: Once | INTRAVENOUS | Status: AC
  Administered 2017-06-19: 17:00:00 via INTRAVENOUS

## 2017-06-19 MED ORDER — LEVOTHYROXINE SODIUM 200 MCG PO TABS
200.0000 ug | ORAL_TABLET | Freq: Once | ORAL | Status: AC
  Administered 2017-06-19: 200 ug via ORAL
  Filled 2017-06-19: qty 1

## 2017-06-19 MED ORDER — OXYCODONE-ACETAMINOPHEN 5-325 MG PO TABS: 1.0000 | ORAL_TABLET | Freq: Four times a day (QID) | ORAL | Status: DC | PRN

## 2017-06-19 MED ORDER — LEVOTHYROXINE SODIUM 200 MCG PO TABS
200.0000 ug | ORAL_TABLET | Freq: Every day | ORAL | Status: DC
  Filled 2017-06-19: qty 1

## 2017-06-19 MED ORDER — MEGESTROL ACETATE 40 MG PO TABS
160.0000 mg | ORAL_TABLET | Freq: Two times a day (BID) | ORAL | Status: DC
  Filled 2017-06-19: qty 4

## 2017-06-19 MED ORDER — ESTROGENS CONJUGATED 25 MG IJ SOLR
25.0000 mg | INTRAMUSCULAR | Status: DC
  Filled 2017-06-19: qty 25

## 2017-06-19 MED ORDER — MEGESTROL ACETATE 40 MG PO TABS
80.0000 mg | ORAL_TABLET | Freq: Two times a day (BID) | ORAL | Status: DC
  Administered 2017-06-19 – 2017-06-20 (×2): 80 mg via ORAL
  Filled 2017-06-19 (×2): qty 2

## 2017-06-19 MED ORDER — HYDROCHLOROTHIAZIDE 25 MG PO TABS
25.0000 mg | ORAL_TABLET | Freq: Every day | ORAL | Status: DC
  Administered 2017-06-20: 25 mg via ORAL
  Filled 2017-06-19: qty 1

## 2017-06-19 MED ORDER — SODIUM CHLORIDE 0.9 % IV SOLN: Freq: Once | INTRAVENOUS | Status: DC

## 2017-06-19 MED ORDER — LEVOTHYROXINE SODIUM 175 MCG PO TABS: 175.0000 ug | ORAL_TABLET | Freq: Every day | ORAL | Status: DC

## 2017-06-19 MED ORDER — IBUPROFEN 800 MG PO TABS
800.0000 mg | ORAL_TABLET | Freq: Three times a day (TID) | ORAL | Status: DC
  Administered 2017-06-19 – 2017-06-20 (×2): 800 mg via ORAL
  Filled 2017-06-19 (×3): qty 1

## 2017-06-19 MED ORDER — HYDROCHLOROTHIAZIDE 25 MG PO TABS: 25.0000 mg | ORAL_TABLET | Freq: Once | ORAL | Status: DC

## 2017-06-19 MED ORDER — ENSURE ENLIVE PO LIQD
237.0000 mL | Freq: Two times a day (BID) | ORAL | Status: DC
  Filled 2017-06-19: qty 237

## 2017-06-19 MED ORDER — KETOROLAC TROMETHAMINE 15 MG/ML IJ SOLN
15.0000 mg | Freq: Once | INTRAMUSCULAR | Status: AC
  Administered 2017-06-19: 15 mg via INTRAVENOUS
  Filled 2017-06-19: qty 1

## 2017-06-19 MED ORDER — SODIUM CHLORIDE 0.9 % IV SOLN
Freq: Once | INTRAVENOUS | Status: AC
  Administered 2017-06-20: via INTRAVENOUS

## 2017-06-19 MED ORDER — MEGESTROL ACETATE 40 MG PO TABS
80.0000 mg | ORAL_TABLET | Freq: Two times a day (BID) | ORAL | Status: DC
  Filled 2017-06-19 (×2): qty 2

## 2017-06-19 NOTE — ED Notes (Signed)
Report to [facility].

## 2017-06-19 NOTE — ED Notes (Signed)
Updated report to Skeet Latch, RN

## 2017-06-19 NOTE — ED Notes (Addendum)
Pt states she was at work and started to feel funny while walking down stairs. Pt denies LOC but states she felt as though she was about to pass out. Pt also reports she is having vaginal discharge with some blood in same. Pt denies a foul odor with same.

## 2017-06-19 NOTE — ED Provider Notes (Addendum)
[facility] DEPT Provider Note   CSN: [id number] Arrival date & time: 06/19/17  0423  Time seen 05:25 AM   History   Chief Complaint Chief Complaint  Patient presents with  . Near Syncope  . Weakness  . Vaginal Bleeding    HPI Holly Hamilton is a 46 y.o. female.  HPI  patient states she went to work about 11 PM and felt okay. About 3 AM she started feeling lightheaded and got sweaty. She denies nausea, vomiting, chest pain, shortness of breath, headache, or falling. She states she has some mild abdominal discomfort mouth is dry. She states she has had abnormal. This month for the first time. She states she's been bleeding for the past 3 weeks. She states her bleeding is watery and she is passing clots. She has an appointment for later this morning with a gynecologist. She does reports she's been told she is anemic in the past and reports she's been eating real bags of ice a week for the past several years. She also reports she is working 2 full-time jobs and she has not been eating or drinking well lately. Patient is G3 P3 Ab0.  PCP  Antonietta Jewel, MD   Past Medical History:  Diagnosis Date  . Grave's disease   . Hypertension     Patient Active Problem List   Diagnosis Date Noted  . Symptomatic anemia 06/19/2017    Past Surgical History:  Procedure Laterality Date  . THYROIDECTOMY      OB History    No data available       Home Medications    Prior to Admission medications   Medication Sig Start Date End Date Taking? Authorizing Provider  hydrochlorothiazide (HYDRODIURIL) 25 MG tablet Take 25 mg by mouth daily.   Yes [provider]  levothyroxine (SYNTHROID, LEVOTHROID) 175 MCG tablet Take 175 mcg by mouth daily before breakfast.   Yes [provider]  benzonatate (TESSALON) 100 MG capsule Take 2 capsules (200 mg total) by mouth 2 (two) times daily as needed for cough. Patient not taking: Reported on 06/19/2017 01/22/17   Nona Dell, PA-C  oxymetazoline [facility] NASAL SPRAY) 0.05 % nasal spray Place 1 spray into both nostrils 2 (two) times daily. Spray once into each nostril twice daily for up to the next 3 days. Do not use for more than 3 days to prevent rebound rhinorrhea. Patient not taking: Reported on 06/19/2017 01/22/17   Nona Dell, PA-C    Family History No family history on file.  Social History Social History  Substance Use Topics  . Smoking status: Never Smoker  . Smokeless tobacco: Not on file  . Alcohol use No  employed   Allergies   Patient has no known allergies.   Review of Systems Review of Systems  All other systems reviewed and are negative.    Physical Exam Updated Vital Signs BP 108/62   Pulse 84   Temp 97.7 F (36.5 C) (Oral)   Resp 18   SpO2 100%   Vital signs normal   Orthostatic VS for the past 24 hrs:  BP- Lying Pulse- Lying BP- Sitting Pulse- Sitting BP- Standing at 0 minutes Pulse- Standing at 0 minutes  06/19/17 0536 98/63 85 105/71 93 115/68 101    Orthostatic Vital signs normal      Physical Exam  Constitutional: She is oriented to person, place, and time. She appears well-developed and well-nourished.  Non-toxic appearance. She does not appear ill. No distress.  HENT:  Head: Normocephalic and atraumatic.  Right Ear: External ear normal.  Left Ear: External ear normal.  Nose: Nose normal. No mucosal edema or rhinorrhea.  Mouth/Throat: Oropharynx is clear and moist and mucous membranes are normal. No dental abscesses or uvula swelling.  Mucous membranes are pale  Eyes: EOM are normal. Pupils are equal, round, and reactive to light.  Pale conjunctiva  Neck: Normal range of motion and full passive range of motion without pain. Neck supple.  Cardiovascular: Normal rate, regular rhythm and normal heart sounds.  Exam reveals no gallop and no friction rub.   No murmur heard. Pulmonary/Chest: Effort normal and breath sounds normal. No  respiratory distress. She has no wheezes. She has no rhonchi. She has no rales. She exhibits no tenderness and no crepitus.  Abdominal: Soft. Normal appearance and bowel sounds are normal. She exhibits no distension. There is no tenderness. There is no rebound and no guarding.  Musculoskeletal: Normal range of motion. She exhibits no edema or tenderness.  Moves all extremities well.   Neurological: She is alert and oriented to person, place, and time. She has normal strength. No cranial nerve deficit.  Skin: Skin is warm, dry and intact. No rash noted. No erythema. There is pallor.  Psychiatric: She has a normal mood and affect. Her speech is normal and behavior is normal. Her mood appears not anxious.  Nursing note and vitals reviewed.    ED Treatments / Results  Labs (all labs ordered are listed, but only abnormal results are displayed) Results for orders placed or performed during the hospital encounter of 06/19/17  Comprehensive metabolic panel  Result Value Ref Range   Sodium 137 135 - 145 mmol/L   Potassium 3.1 (L) 3.5 - 5.1 mmol/L   Chloride 104 101 - 111 mmol/L   CO2 24 22 - 32 mmol/L   Glucose, Bld 107 (H) 65 - 99 mg/dL   BUN 14 6 - 20 mg/dL   Creatinine, Ser 0.83 0.44 - 1.00 mg/dL   Calcium 8.5 (L) 8.9 - 10.3 mg/dL   Total Protein 6.9 6.5 - 8.1 g/dL   Albumin 3.1 (L) 3.5 - 5.0 g/dL   AST 27 15 - 41 U/L   ALT 15 14 - 54 U/L   Alkaline Phosphatase 56 38 - 126 U/L   Total Bilirubin 0.2 (L) 0.3 - 1.2 mg/dL   GFR calc non Af Amer >60 >60 mL/min   GFR calc Af Amer >60 >60 mL/min   Anion gap 9 5 - 15  CBC with Differential  Result Value Ref Range   WBC 9.3 4.0 - 10.5 K/uL   RBC 2.82 (L) 3.87 - 5.11 MIL/uL   Hemoglobin 4.4 (LL) 12.0 - 15.0 g/dL   HCT 16.9 (L) 36.0 - 46.0 %   MCV 59.9 (L) 78.0 - 100.0 fL   MCH 15.6 (L) 26.0 - 34.0 pg   MCHC 26.0 (L) 30.0 - 36.0 g/dL   RDW 29.7 (H) 11.5 - 15.5 %   Platelets 833 (H) 150 - 400 K/uL   Neutrophils Relative % 80 %    Lymphocytes Relative 14 %   Monocytes Relative 5 %   Eosinophils Relative 1 %   Basophils Relative 0 %   Neutro Abs 7.4 1.7 - 7.7 K/uL   Lymphs Abs 1.3 0.7 - 4.0 K/uL   Monocytes Absolute 0.5 0.1 - 1.0 K/uL   Eosinophils Absolute 0.1 0.0 - 0.7 K/uL   Basophils Absolute 0.0 0.0 - 0.1 K/uL  RBC Morphology POLYCHROMASIA PRESENT    Laboratory interpretation all normal except  Severe anemia (I was not informed of abnormal lab result), hypokalemia    EKG  EKG Interpretation  Date/Time:  Thursday June 19 2017 04:24:39 EDT Ventricular Rate:  75 PR Interval:    QRS Duration: 88 QT Interval:  413 QTC Calculation: 462 R Axis:   66 Text Interpretation:  Sinus rhythm Otherwise within normal limits Baseline wander No old tracing to compare Confirmed by Rolland Porter [phone]) on 06/19/2017 4:28:24 AM       Radiology No results found.  Procedures Procedures (including critical care time)  CRITICAL CARE Performed by: Kerigan Narvaez L Magdalene Tardiff Total critical care time: 35 minutes Critical care time was exclusive of separately billable procedures and treating other patients. Critical care was necessary to treat or prevent imminent or life-threatening deterioration. Critical care was time spent personally by me on the following activities: development of treatment plan with patient and/or surrogate as well as nursing, discussions with consultants, evaluation of patient's response to treatment, examination of patient, obtaining history from patient or surrogate, ordering and performing treatments and interventions, ordering and review of laboratory studies, ordering and review of radiographic studies, pulse oximetry and re-evaluation of patient's condition.   Medications Ordered in ED Medications  0.9 %  sodium chloride infusion (not administered)     Initial Impression / Assessment and Plan / ED Course  I have reviewed the triage vital signs and the nursing notes.  Pertinent labs & imaging results  that were available during my care of the patient were reviewed by me and considered in my medical decision making (see chart for details).   6:40 AM I happened to notice patient has a ready profound anemia. She was typed and crossed for 2 units of blood. Patient and her family were informed of her test results. She again stated she had an appointment at 9:30 this morning at the breast center and I heard the message. However she feels like she's getting a mammogram and a Pap smear done, I do not think they do Pap smears there. I will talk to the GYN on-call about her profound anemia from vaginal bleeding.  6:59 AM Dr. Glo Herring, GYN on-call at [facility], states to [facility] for admission under his service.  08:40 AM pt still agreeable to transfer to [facility]   Final Clinical Impressions(s) / ED Diagnoses   Final diagnoses:  Symptomatic anemia  Abnormal vaginal bleeding  Near syncope    Plan admission at [facility], MD, FACEP   New Prescriptions New Prescriptions   No medications on file     Rolland Porter, MD 06/19/17 7824    Rolland Porter, MD 06/19/17 [phone]

## 2017-06-19 NOTE — ED Notes (Signed)
Report to Caitlyn at [facility]. Pt is going to Room 319 at [facility].

## 2017-06-19 NOTE — ED Notes (Signed)
[place] link

## 2017-06-19 NOTE — H&P (Signed)
History and Physical  Holly Hamilton is a 46 y.o. G3P3 who presents with c/o dizziness and extreme weakness while at work earlier this am. Pt had Hgb of 4 in the [facility]. Pt reports that she has been bleeding for 3 weeks and reports that on last night she wok up in a pool of blood and had blood running down her legs and on to the floor. Was never told that she had fibroids.  Last GYN visit unknown.  Pt has not been to a GYN provider for many years. Pt reports that with the bleeding sh ehas also been having pain.  She has been taking her husbands pain meds.     CC: active vaginal bleeding  Past Medical History:  Diagnosis Date  . Anemia   . Grave's disease   . 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 THYROIDECTOMY     OB History    No data available     Patient denies any cervical dysplasia or STIs. Prescriptions Prior to Admission  Medication Sig Dispense Refill Last Dose  . hydrochlorothiazide (HYDRODIURIL) 25 MG tablet Take 25 mg by mouth daily.   06/18/2017 at Unknown time  . levothyroxine (SYNTHROID, LEVOTHROID) 175 MCG tablet Take 175 mcg by mouth daily before breakfast.   06/18/2017 at Unknown time  . benzonatate (TESSALON) 100 MG capsule Take 2 capsules (200 mg total) by mouth 2 (two) times daily as needed for cough. (Patient not taking: Reported on 06/19/2017) 20 capsule 0 Completed Course at Unknown time  . oxymetazoline (AFRIN NASAL SPRAY) 0.05 % nasal spray Place 1 spray into both nostrils 2 (two) times daily. Spray once into each nostril twice daily for up to the next 3 days. Do not use for more than 3 days to prevent rebound rhinorrhea. (Patient not taking: Reported on 06/19/2017) 30 mL 0 Completed Course at Unknown time    No Known Allergies Social History:   reports that she has never smoked. She does not have any smokeless tobacco history on file. She reports that she does not drink  alcohol or use drugs. History reviewed. No pertinent family history.  Review of Systems: Noncontributory  PHYSICAL EXAM: Blood pressure 116/78, pulse 84, temperature 98.4 F (36.9 C), resp. rate 18, SpO2 100 %. General appearance - alert, well appearing, and in no distress. When I initially saw pt she was en route to the [place] with blood running down her legs.  Chest - clear to auscultation, no wheezes, rales or rhonchi, symmetric air entry Heart - normal rate and regular rhythm Abdomen - soft, nontender, nondistended, no masses or organomegaly Pelvic - examination deferred Extremities - peripheral pulses normal, no pedal edema, no clubbing or cyanosis; pt has very pale nail beds.   Labs: Results for orders placed or performed during the hospital encounter of 06/19/17 (from the past 336 hour(s))  Comprehensive metabolic panel   Collection Time: 06/19/17  4:48 AM  Result Value Ref Range   Sodium 137 135 - 145 mmol/L   Potassium 3.1 (L) 3.5 - 5.1 mmol/L   Chloride 104 101 - 111 mmol/L   CO2 24 22 - 32 mmol/L   Glucose, Bld 107 (H) 65 - 99 mg/dL   BUN 14 6 - 20 mg/dL   Creatinine, Ser 0.83 0.44 - 1.00 mg/dL   Calcium 8.5 (L) 8.9 - 10.3 mg/dL   Total Protein 6.9 6.5 - 8.1 g/dL  Albumin 3.1 (L) 3.5 - 5.0 g/dL   AST 27 15 - 41 U/L   ALT 15 14 - 54 U/L   Alkaline Phosphatase 56 38 - 126 U/L   Total Bilirubin 0.2 (L) 0.3 - 1.2 mg/dL   GFR calc non Af Amer >60 >60 mL/min   GFR calc Af Amer >60 >60 mL/min   Anion gap 9 5 - 15  CBC with Differential   Collection Time: 06/19/17  4:48 AM  Result Value Ref Range   WBC 9.3 4.0 - 10.5 K/uL   RBC 2.82 (L) 3.87 - 5.11 MIL/uL   Hemoglobin 4.4 (LL) 12.0 - 15.0 g/dL   HCT 16.9 (L) 36.0 - 46.0 %   MCV 59.9 (L) 78.0 - 100.0 fL   MCH 15.6 (L) 26.0 - 34.0 pg   MCHC 26.0 (L) 30.0 - 36.0 g/dL   RDW 29.7 (H) 11.5 - 15.5 %   Platelets 833 (H) 150 - 400 K/uL   Neutrophils Relative % 80 %   Lymphocytes Relative 14 %   Monocytes Relative 5 %    Eosinophils Relative 1 %   Basophils Relative 0 %   Neutro Abs 7.4 1.7 - 7.7 K/uL   Lymphs Abs 1.3 0.7 - 4.0 K/uL   Monocytes Absolute 0.5 0.1 - 1.0 K/uL   Eosinophils Absolute 0.1 0.0 - 0.7 K/uL   Basophils Absolute 0.0 0.0 - 0.1 K/uL   RBC Morphology POLYCHROMASIA PRESENT   Prepare RBC   Collection Time: 06/19/17  6:41 AM  Result Value Ref Range   Order Confirmation ORDER PROCESSED BY BLOOD BANK   Type and screen [place]   Collection Time: 06/19/17  6:51 AM  Result Value Ref Range   ABO/RH(D) O POS    Antibody Screen NEG    Sample Expiration 06/22/2017    Unit Number [id number]    Blood Component Type RBC, LR IRR    Unit division 00    Status of Unit ISSUED    Transfusion Status OK TO TRANSFUSE    Crossmatch Result Compatible    Unit Number [id number]    Blood Component Type RBC LR PHER1    Unit division 00    Status of Unit ALLOCATED    Transfusion Status OK TO TRANSFUSE    Crossmatch Result Compatible   ABO/Rh   Collection Time: 06/19/17  6:51 AM  Result Value Ref Range   ABO/RH(D) O POS   BPAM RBC   Collection Time: 06/19/17  6:51 AM  Result Value Ref Range   ISSUE DATE / TIME [id number]    Blood Product Unit Number [id number]    PRODUCT CODE [id number]    Unit Type and Rh 9500    Blood Product Expiration Date [id number]    ISSUE DATE / TIME [id number]    Blood Product Unit Number [id number]    PRODUCT CODE [id number]    Unit Type and Rh 9500    Blood Product Expiration Date [id number]   Type and screen [place]   Collection Time: 06/19/17 10:05 AM  Result Value Ref Range   ABO/RH(D) O POS    Antibody Screen NEG    Sample Expiration 06/22/2017   TSH   Collection Time: 06/19/17 10:06 AM  Result Value Ref Range   TSH 0.224 (L) 0.350 - 4.500 uIU/mL  Prepare RBC   Collection Time: 06/19/17 10:06 AM  Result Value Ref Range   Order Confirmation ORDER PROCESSED BY BLOOD BANK   ABO/Rh  Collection  Time: 06/19/17 10:15 AM  Result Value Ref Range   ABO/RH(D) O POS     Imaging Studies: US Transvaginal Non-ob  Result Date: 06/19/2017 CLINICAL DATA:  46 year old female with abnormal uterine bleeding for 3 weeks and lower abdominal pain. Anemia. LMP 05/22/2017. EXAM: TRANSABDOMINAL AND TRANSVAGINAL ULTRASOUND OF PELVIS TECHNIQUE: Both transabdominal and transvaginal ultrasound examinations of the pelvis were performed. Transabdominal technique was performed for global imaging of the pelvis including uterus, ovaries, adnexal regions, and pelvic cul-de-sac. It was necessary to proceed with endovaginal exam following the transabdominal exam to visualize the endometrium, myometrium and adnexa. COMPARISON:  None FINDINGS: Uterus Measurements: 12.5 x 7.0 x 7.7 cm. Enlarged anteverted myomatous uterus, with representative fibroids as follows: - left posterior fundal 4.8 x 4.1 x 4.6 cm subserosal fibroid - right anterior uterine body intramural 3.6 x 2.5 x 3.6 cm fibroid - posterior right uterine body intramural 1.5 x 1.2 x 1.6 cm fibroid - right anterior uterine body intramural 2.1 x 2.1 x 1.7 cm fibroid Endometrium There is a large 4.6 x 3.6 x 3.9 cm mass distending the endometrial cavity, which demonstrates heterogeneous echogenicity and internal vascularity on color Doppler. Right ovary Measurements: 3.3 x 1.9 x 2.0 cm. Normal appearance/no adnexal mass. Left ovary Measurements: 3.1 x 1.9 x 2.3 cm. Normal appearance/no adnexal mass. Other findings No abnormal free fluid. IMPRESSION: 1. Enlarged anteverted myomatous uterus as detailed . 2. Large 4.6 x 3.6 x 3.9 cm heterogeneous solid mass distending the endometrial cavity, most likely to represent an intracavitary fibroid. Endometrial malignancy is less likely but not excluded. Consider hysteroscopic evaluation with directed tissue sampling. Saline infusion hysterosonography could be obtained for further characterization, as clinically warranted. 3. Normal  ovaries.  No adnexal masses. Electronically Signed   By: Ilona Sorrel M.D.   On: 06/19/2017 11:12   US Pelvis Complete  Result Date: 06/19/2017 CLINICAL DATA:  46 year old female with abnormal uterine bleeding for 3 weeks and lower abdominal pain. Anemia. LMP 05/22/2017. EXAM: TRANSABDOMINAL AND TRANSVAGINAL ULTRASOUND OF PELVIS TECHNIQUE: Both transabdominal and transvaginal ultrasound examinations of the pelvis were performed. Transabdominal technique was performed for global imaging of the pelvis including uterus, ovaries, adnexal regions, and pelvic cul-de-sac. It was necessary to proceed with endovaginal exam following the transabdominal exam to visualize the endometrium, myometrium and adnexa. COMPARISON:  None FINDINGS: Uterus Measurements: 12.5 x 7.0 x 7.7 cm. Enlarged anteverted myomatous uterus, with representative fibroids as follows: - left posterior fundal 4.8 x 4.1 x 4.6 cm subserosal fibroid - right anterior uterine body intramural 3.6 x 2.5 x 3.6 cm fibroid - posterior right uterine body intramural 1.5 x 1.2 x 1.6 cm fibroid - right anterior uterine body intramural 2.1 x 2.1 x 1.7 cm fibroid Endometrium There is a large 4.6 x 3.6 x 3.9 cm mass distending the endometrial cavity, which demonstrates heterogeneous echogenicity and internal vascularity on color Doppler. Right ovary Measurements: 3.3 x 1.9 x 2.0 cm. Normal appearance/no adnexal mass. Left ovary Measurements: 3.1 x 1.9 x 2.3 cm. Normal appearance/no adnexal mass. Other findings No abnormal free fluid. IMPRESSION: 1. Enlarged anteverted myomatous uterus as detailed . 2. Large 4.6 x 3.6 x 3.9 cm heterogeneous solid mass distending the endometrial cavity, most likely to represent an intracavitary fibroid. Endometrial malignancy is less likely but not excluded. Consider hysteroscopic evaluation with directed tissue sampling. Saline infusion hysterosonography could be obtained for further characterization, as clinically warranted. 3. Normal  ovaries.  No adnexal masses. Electronically Signed   By: Corene Cornea  A Poff M.D.   On: 06/19/2017 11:12    Assessment: Patient Active Problem List   Diagnosis Date Noted  . Symptomatic anemia 06/19/2017  . Abnormal uterine bleeding (AUB) 06/19/2017  . Fibroids 06/19/2017  EES contraindicated due to carotid aa blockage.  Plan: Admit Cont transfusion of PRBCs- 3 units total CBC after transfusion Increase Synthroid from 175 to 225mcg daily Reg diet Pelvic US Megace 80bid for 2 days Scheduled hysteroscopy with D&C and resection of fibroids with PAP      Deina Lipsey L. Ihor Dow, M.D., [facility] 06/19/2017 11:37 AM

## 2017-06-19 NOTE — ED Triage Notes (Signed)
Pt from work. Was seen staggering around and had a near syncopal episode. Generalized weakness. Sts she had had little to eat today & has had vaginal bleeding x3 wks. Also c/o cramping pain, rated @ an 8/10 @ this time. Pt A&O x4. Hx of HTN & thyroid issues.

## 2017-06-20 LAB — TYPE AND SCREEN
ABO/RH(D): O POS
ANTIBODY SCREEN: NEGATIVE
UNIT DIVISION: 0
Unit division: 0

## 2017-06-20 LAB — CBC
HCT: 31.6 % — ABNORMAL LOW (ref 36.0–46.0)
Hemoglobin: 9.6 g/dL — ABNORMAL LOW (ref 12.0–15.0)
MCH: 21 pg — AB (ref 26.0–34.0)
MCHC: 30.4 g/dL (ref 30.0–36.0)
MCV: 69 fL — AB (ref 78.0–100.0)
PLATELETS: 844 10*3/uL — AB (ref 150–400)
RBC: 4.58 MIL/uL (ref 3.87–5.11)
RDW: 29.3 % — AB (ref 11.5–15.5)
WBC: 9 10*3/uL (ref 4.0–10.5)

## 2017-06-20 LAB — BPAM RBC
BLOOD PRODUCT EXPIRATION DATE: 201807012359
BLOOD PRODUCT EXPIRATION DATE: 201807022359
ISSUE DATE / TIME: 201806272330
ISSUE DATE / TIME: 201806280753
UNIT TYPE AND RH: 9500
UNIT TYPE AND RH: 9500

## 2017-06-20 LAB — T3: T3 TOTAL: 101 ng/dL (ref 71–180)

## 2017-06-20 LAB — HIV ANTIBODY (ROUTINE TESTING W REFLEX): HIV SCREEN 4TH GENERATION: NONREACTIVE

## 2017-06-20 MED ORDER — IBUPROFEN 800 MG PO TABS: 800.0000 mg | ORAL_TABLET | Freq: Three times a day (TID) | ORAL | 0 refills | Status: DC

## 2017-06-20 MED ORDER — LEVOTHYROXINE SODIUM 112 MCG PO TABS
166.0000 ug | ORAL_TABLET | Freq: Every day | ORAL | Status: DC
  Filled 2017-06-20: qty 1.5

## 2017-06-20 MED ORDER — MEGESTROL ACETATE 40 MG PO TABS
40.0000 mg | ORAL_TABLET | Freq: Two times a day (BID) | ORAL | 0 refills | Status: DC
Start: 2017-06-20 — End: 2017-07-21

## 2017-06-20 MED ORDER — OXYCODONE-ACETAMINOPHEN 5-325 MG PO TABS: 1.0000 | ORAL_TABLET | Freq: Four times a day (QID) | ORAL | 0 refills | Status: DC | PRN

## 2017-06-20 MED ORDER — FERROUS SULFATE 325 (65 FE) MG PO TABS: 325.0000 mg | ORAL_TABLET | Freq: Two times a day (BID) | ORAL | 1 refills | Status: DC

## 2017-06-20 MED ORDER — LEVOTHYROXINE SODIUM 112 MCG PO TABS: 166.0000 ug | ORAL_TABLET | Freq: Every day | ORAL | 1 refills | Status: DC

## 2017-06-20 NOTE — Discharge Summary (Signed)
Physician Discharge Summary  Patient ID: Holly Hamilton MRN: [id number] DOB/AGE: March 02, 1971 46 y.o.  Admit date: 06/19/2017 Discharge date: 06/20/2017  Admission Diagnoses: symptomatic anemia; AUB  Discharge Diagnoses:  Principal Problem:   Abnormal uterine bleeding (AUB) Active Problems:   Symptomatic anemia   Fibroids Cardiac murmer  Discharged Condition: good  Hospital Course: Pt was admitted and received 3 units of PRBCs due to symptomatic anemia.  She had an [place] during this admission and was given Megace to control the bleeding. At the time is discharge, the patient was feeling much better. Her bleeding was scant. She was scheduled for an outpatient procedure to eval and tx the endometrial mass.  Pt reports that she was told in the past that she had a cardiac murmer but, she did not get it further eval.  She will need referral to a primary care provider. Will get this once she's outpatient.      Consults: None  Significant Diagnostic Studies: labs: CBC, Type and screen and radiology: Ultrasound: pelvis  Treatments: transfusion 3 units of PRBCs  Discharge Exam: Blood pressure 135/83, pulse 80, temperature 98.6 F (37 C), temperature source Oral, resp. rate 18, height 5\' 4"  (1.626 m), weight 177 lb 8 oz (80.5 kg), SpO2 100 %. General appearance: alert, appears stated age and no distress Cardio: systolic murmur: early systolic 3/6, blowing at 2nd left intercostal space GI: soft, non-tender; bowel sounds normal; no masses,  no organomegaly Extremities: extremities normal, atraumatic, no cyanosis or edema  Disposition: 01-Home or Self Care  Discharge Instructions    Call MD for:  difficulty breathing, headache or visual disturbances    Complete by:  As directed    Call MD for:  extreme fatigue    Complete by:  As directed    Call MD for:  persistant dizziness or light-headedness    Complete by:  As directed    Call MD for:  persistant nausea and vomiting    Complete by:  As  directed    Call MD for:  severe uncontrolled pain    Complete by:  As directed    Call MD for:  temperature >100.4    Complete by:  As directed    Diet - low sodium heart healthy    Complete by:  As directed    Increase activity slowly    Complete by:  As directed      Allergies as of 06/20/2017   No Known Allergies     Medication List    STOP taking these medications   benzonatate 100 MG capsule Commonly known as:  TESSALON   oxymetazoline 0.05 % nasal spray Commonly known as:  AFRIN NASAL SPRAY     TAKE these medications   ferrous sulfate 325 (65 FE) MG tablet Commonly known as:  FERROUSUL Take 1 tablet (325 mg total) by mouth 2 (two) times daily.   hydrochlorothiazide 25 MG tablet Commonly known as:  HYDRODIURIL Take 25 mg by mouth daily.   ibuprofen 800 MG tablet Commonly known as:  ADVIL,MOTRIN Take 1 tablet (800 mg total) by mouth 3 (three) times daily.   levothyroxine 112 MCG tablet Commonly known as:  SYNTHROID, LEVOTHROID Take 1.5 tablets (168 mcg total) by mouth daily before breakfast. What changed:  medication strength  how much to take   megestrol 40 MG tablet Commonly known as:  MEGACE Take 1 tablet (40 mg total) by mouth 2 (two) times daily.   oxyCODONE-acetaminophen 5-325 MG tablet Commonly known as:  PERCOCET/ROXICET  Take 1-2 tablets by mouth every 6 (six) hours as needed for moderate pain or severe pain.        SignedLavonia Drafts 06/20/2017, 10:05 AM

## 2017-06-20 NOTE — Progress Notes (Signed)
Discharge instructions provided, questions answered, pt states understanding, signed and given copy

## 2017-06-21 LAB — BPAM RBC
BLOOD PRODUCT EXPIRATION DATE: 201807172359
Blood Product Expiration Date: 201807152359
Blood Product Expiration Date: 201807152359
Blood Product Expiration Date: 201807172359
ISSUE DATE / TIME: 201806281413
ISSUE DATE / TIME: 201806290133
ISSUE DATE / TIME: 201806290149
ISSUE DATE / TIME: 201806281715
UNIT TYPE AND RH: 5100
UNIT TYPE AND RH: 5100
Unit Type and Rh: 5100
Unit Type and Rh: 5100

## 2017-06-21 LAB — TYPE AND SCREEN
ABO/RH(D): O POS
ANTIBODY SCREEN: NEGATIVE
UNIT DIVISION: 0
UNIT DIVISION: 0
UNIT DIVISION: 0
UNIT DIVISION: 0

## 2017-06-22 HISTORY — PX: ENDOMETRIAL ABLATION W/ NOVASURE: SUR434

## 2017-06-27 ENCOUNTER — Encounter (HOSPITAL_COMMUNITY): Payer: Self-pay | Admitting: Anesthesiology

## 2017-06-27 ENCOUNTER — Ambulatory Visit (HOSPITAL_COMMUNITY): Payer: Self-pay | Admitting: Anesthesiology

## 2017-06-27 ENCOUNTER — Ambulatory Visit (HOSPITAL_COMMUNITY): Payer: Self-pay

## 2017-06-27 ENCOUNTER — Encounter (HOSPITAL_COMMUNITY)
Admission: RE | Disposition: A | Discharge status: Home / Self Care | Payer: Self-pay | Source: Ambulatory Visit | Attending: Obstetrics & Gynecology

## 2017-06-27 ENCOUNTER — Ambulatory Visit (HOSPITAL_COMMUNITY)
Admission: RE | Admit: 2017-06-27 | Discharge: 2017-06-27 | Disposition: A | Discharge status: Home / Self Care | Payer: Self-pay | Source: Ambulatory Visit | Attending: Obstetrics & Gynecology | Admitting: Obstetrics & Gynecology

## 2017-06-27 DIAGNOSIS — D251 Intramural leiomyoma of uterus: Secondary | ICD-10-CM | POA: Insufficient documentation

## 2017-06-27 DIAGNOSIS — I10 Essential (primary) hypertension: Secondary | ICD-10-CM | POA: Insufficient documentation

## 2017-06-27 DIAGNOSIS — D250 Submucous leiomyoma of uterus: Secondary | ICD-10-CM

## 2017-06-27 DIAGNOSIS — N938 Other specified abnormal uterine and vaginal bleeding: Secondary | ICD-10-CM | POA: Insufficient documentation

## 2017-06-27 DIAGNOSIS — E0500 Thyrotoxicosis with diffuse goiter without thyrotoxic crisis or storm: Secondary | ICD-10-CM | POA: Insufficient documentation

## 2017-06-27 DIAGNOSIS — N939 Abnormal uterine and vaginal bleeding, unspecified: Secondary | ICD-10-CM | POA: Diagnosis present

## 2017-06-27 DIAGNOSIS — D252 Subserosal leiomyoma of uterus: Secondary | ICD-10-CM | POA: Insufficient documentation

## 2017-06-27 DIAGNOSIS — D649 Anemia, unspecified: Secondary | ICD-10-CM | POA: Insufficient documentation

## 2017-06-27 DIAGNOSIS — E8770 Fluid overload, unspecified: Secondary | ICD-10-CM

## 2017-06-27 DIAGNOSIS — D219 Benign neoplasm of connective and other soft tissue, unspecified: Secondary | ICD-10-CM | POA: Diagnosis present

## 2017-06-27 HISTORY — PX: DILITATION & CURRETTAGE/HYSTROSCOPY WITH NOVASURE ABLATION: SHX5568

## 2017-06-27 HISTORY — DX: Headache: R51

## 2017-06-27 HISTORY — DX: Headache, unspecified: R51.9

## 2017-06-27 LAB — BASIC METABOLIC PANEL
ANION GAP: 8 (ref 5–15)
ANION GAP: 9 (ref 5–15)
BUN: 22 mg/dL — AB (ref 6–20)
BUN: 18 mg/dL (ref 6–20)
CALCIUM: 9.1 mg/dL (ref 8.9–10.3)
CHLORIDE: 111 mmol/L (ref 101–111)
CO2: 22 mmol/L (ref 22–32)
CO2: 19 mmol/L — AB (ref 22–32)
Calcium: 8.5 mg/dL — ABNORMAL LOW (ref 8.9–10.3)
Chloride: 107 mmol/L (ref 101–111)
Creatinine, Ser: 0.77 mg/dL (ref 0.44–1.00)
Creatinine, Ser: 0.71 mg/dL (ref 0.44–1.00)
GFR calc Af Amer: >60 mL/min (ref >60)
GFR calc Af Amer: >60 mL/min (ref >60)
GLUCOSE: 92 mg/dL (ref 65–99)
GLUCOSE: 101 mg/dL — AB (ref 65–99)
POTASSIUM: 3.1 mmol/L — AB (ref 3.5–5.1)
POTASSIUM: 3.5 mmol/L (ref 3.5–5.1)
SODIUM: 137 mmol/L (ref 135–145)
Sodium: 139 mmol/L (ref 135–145)

## 2017-06-27 LAB — CBC
HEMATOCRIT: 31.2 % — AB (ref 36.0–46.0)
Hemoglobin: 9.4 g/dL — ABNORMAL LOW (ref 12.0–15.0)
MCH: 21.1 pg — ABNORMAL LOW (ref 26.0–34.0)
MCHC: 30.1 g/dL (ref 30.0–36.0)
MCV: 70 fL — AB (ref 78.0–100.0)
Platelets: 436 10*3/uL — ABNORMAL HIGH (ref 150–400)
RBC: 4.46 MIL/uL (ref 3.87–5.11)
WBC: 7 10*3/uL (ref 4.0–10.5)

## 2017-06-27 LAB — TYPE AND SCREEN
ABO/RH(D): O POS
ANTIBODY SCREEN: NEGATIVE

## 2017-06-27 SURGERY — DILATATION & CURETTAGE/HYSTEROSCOPY WITH NOVASURE ABLATION
Anesthesia: General | Site: Vagina

## 2017-06-27 MED ORDER — BUPIVACAINE HCL (PF) 0.5 % IJ SOLN
INTRAMUSCULAR | Status: AC
  Filled 2017-06-27: qty 30

## 2017-06-27 MED ORDER — SCOPOLAMINE 1 MG/3DAYS TD PT72
1.0000 | MEDICATED_PATCH | Freq: Once | TRANSDERMAL | Status: DC
  Administered 2017-06-27: 1.5 mg via TRANSDERMAL

## 2017-06-27 MED ORDER — LACTATED RINGERS IV SOLN
INTRAVENOUS | Status: DC
  Administered 2017-06-27 (×2): via INTRAVENOUS

## 2017-06-27 MED ORDER — SCOPOLAMINE 1 MG/3DAYS TD PT72
MEDICATED_PATCH | TRANSDERMAL | Status: AC
  Administered 2017-06-27: 1.5 mg via TRANSDERMAL
  Filled 2017-06-27: qty 1

## 2017-06-27 MED ORDER — MIDAZOLAM HCL 2 MG/2ML IJ SOLN
INTRAMUSCULAR | Status: DC | PRN
  Administered 2017-06-27: 2 mg via INTRAVENOUS

## 2017-06-27 MED ORDER — LACTATED RINGERS IV SOLN: INTRAVENOUS | Status: DC

## 2017-06-27 MED ORDER — PROPOFOL 10 MG/ML IV BOLUS
INTRAVENOUS | Status: DC | PRN
  Administered 2017-06-27: 180 mg via INTRAVENOUS
  Administered 2017-06-27: 20 mg via INTRAVENOUS

## 2017-06-27 MED ORDER — KETOROLAC TROMETHAMINE 30 MG/ML IJ SOLN
INTRAMUSCULAR | Status: DC | PRN
  Administered 2017-06-27: 30 mg via INTRAVENOUS

## 2017-06-27 MED ORDER — LIDOCAINE HCL (CARDIAC) 20 MG/ML IV SOLN
INTRAVENOUS | Status: DC | PRN
  Administered 2017-06-27: 80 mg via INTRAVENOUS

## 2017-06-27 MED ORDER — IBUPROFEN 800 MG PO TABS: 800.0000 mg | ORAL_TABLET | Freq: Three times a day (TID) | ORAL | 0 refills | Status: DC | PRN

## 2017-06-27 MED ORDER — FUROSEMIDE 10 MG/ML IJ SOLN
20.0000 mg | Freq: Once | INTRAMUSCULAR | Status: AC
  Administered 2017-06-27: 20 mg via INTRAVENOUS

## 2017-06-27 MED ORDER — LIDOCAINE HCL 1 % IJ SOLN
INTRAMUSCULAR | Status: AC
  Filled 2017-06-27: qty 10

## 2017-06-27 MED ORDER — KETOROLAC TROMETHAMINE 30 MG/ML IJ SOLN
INTRAMUSCULAR | Status: AC
  Filled 2017-06-27: qty 1

## 2017-06-27 MED ORDER — MEPERIDINE HCL 25 MG/ML IJ SOLN: 6.2500 mg | INTRAMUSCULAR | Status: DC | PRN

## 2017-06-27 MED ORDER — LIDOCAINE HCL (CARDIAC) 20 MG/ML IV SOLN
INTRAVENOUS | Status: AC
  Filled 2017-06-27: qty 5

## 2017-06-27 MED ORDER — BUPIVACAINE HCL (PF) 0.5 % IJ SOLN
INTRAMUSCULAR | Status: DC | PRN
  Administered 2017-06-27: 20 mL

## 2017-06-27 MED ORDER — FUROSEMIDE 10 MG/ML IJ SOLN
INTRAMUSCULAR | Status: AC
  Filled 2017-06-27: qty 2

## 2017-06-27 MED ORDER — ONDANSETRON HCL 4 MG/2ML IJ SOLN
INTRAMUSCULAR | Status: AC
  Filled 2017-06-27: qty 2

## 2017-06-27 MED ORDER — KETOROLAC TROMETHAMINE 30 MG/ML IJ SOLN: 30.0000 mg | Freq: Once | INTRAMUSCULAR | Status: DC | PRN

## 2017-06-27 MED ORDER — PROPOFOL 10 MG/ML IV BOLUS
INTRAVENOUS | Status: AC
  Filled 2017-06-27: qty 20

## 2017-06-27 MED ORDER — PHENYLEPHRINE HCL 10 MG/ML IJ SOLN
INTRAMUSCULAR | Status: AC
  Filled 2017-06-27: qty 1

## 2017-06-27 MED ORDER — ONDANSETRON HCL 4 MG/2ML IJ SOLN
INTRAMUSCULAR | Status: DC | PRN
Start: 2017-06-27 — End: 2017-06-27
  Administered 2017-06-27: 4 mg via INTRAVENOUS

## 2017-06-27 MED ORDER — FENTANYL CITRATE (PF) 100 MCG/2ML IJ SOLN: 25.0000 ug | INTRAMUSCULAR | Status: DC | PRN

## 2017-06-27 MED ORDER — MIDAZOLAM HCL 2 MG/2ML IJ SOLN
INTRAMUSCULAR | Status: AC
  Filled 2017-06-27: qty 2

## 2017-06-27 MED ORDER — FENTANYL CITRATE (PF) 250 MCG/5ML IJ SOLN
INTRAMUSCULAR | Status: AC
  Filled 2017-06-27: qty 5

## 2017-06-27 MED ORDER — PHENYLEPHRINE 40 MCG/ML (10ML) SYRINGE FOR IV PUSH (FOR BLOOD PRESSURE SUPPORT)
PREFILLED_SYRINGE | INTRAVENOUS | Status: AC
  Filled 2017-06-27: qty 10

## 2017-06-27 MED ORDER — FENTANYL CITRATE (PF) 100 MCG/2ML IJ SOLN
INTRAMUSCULAR | Status: DC | PRN
  Administered 2017-06-27: 25 ug via INTRAVENOUS
  Administered 2017-06-27: 50 ug via INTRAVENOUS

## 2017-06-27 MED ORDER — ONDANSETRON HCL 4 MG/2ML IJ SOLN: 4.0000 mg | Freq: Once | INTRAMUSCULAR | Status: DC | PRN

## 2017-06-27 MED ORDER — PHENYLEPHRINE HCL 10 MG/ML IJ SOLN
INTRAMUSCULAR | Status: DC | PRN
  Administered 2017-06-27 (×6): 80 ug via INTRAVENOUS

## 2017-06-27 MED ORDER — SODIUM CHLORIDE 0.9 % IR SOLN
Status: DC | PRN
  Administered 2017-06-27: 3000 mL

## 2017-06-27 MED ORDER — DEXAMETHASONE SODIUM PHOSPHATE 10 MG/ML IJ SOLN
INTRAMUSCULAR | Status: DC | PRN
  Administered 2017-06-27: 10 mg via INTRAVENOUS

## 2017-06-27 SURGICAL SUPPLY — 19 items
ABLATOR ENDOMETRIAL BIPOLAR (ABLATOR) ×2 IMPLANT
CATH ROBINSON RED A/P 16FR (CATHETERS) ×3 IMPLANT
CLOTH BEACON ORANGE TIMEOUT ST (SAFETY) ×3 IMPLANT
CONTAINER PREFILL 10% NBF 60ML (FORM) ×2 IMPLANT
DEVICE MYOSURE REACH (MISCELLANEOUS) IMPLANT
FILTER ARTHROSCOPY CONVERTOR (FILTER) ×3 IMPLANT
GLOVE BIO SURGEON STRL SZ7 (GLOVE) ×3 IMPLANT
GLOVE BIOGEL PI IND STRL 7.0 (GLOVE) ×4 IMPLANT
GLOVE BIOGEL PI INDICATOR 7.0 (GLOVE) ×2
GOWN STRL REUS W/TWL LRG LVL3 (GOWN DISPOSABLE) ×3 IMPLANT
GOWN STRL REUS W/TWL XL LVL3 (GOWN DISPOSABLE) ×3 IMPLANT
MYOSURE XL FIBROID REM (MISCELLANEOUS) ×3
PACK VAGINAL MINOR WOMEN LF (CUSTOM PROCEDURE TRAY) ×3 IMPLANT
PAD OB MATERNITY 4.3X12.25 (PERSONAL CARE ITEMS) ×3 IMPLANT
SEAL ROD LENS SCOPE MYOSURE (ABLATOR) ×3 IMPLANT
SYSTEM TISS REMOVAL MYSR XL RM (MISCELLANEOUS) ×1 IMPLANT
TOWEL OR 17X24 6PK STRL BLUE (TOWEL DISPOSABLE) ×6 IMPLANT
TUBING AQUILEX INFLOW (TUBING) ×3 IMPLANT
TUBING AQUILEX OUTFLOW (TUBING) ×3 IMPLANT

## 2017-06-27 NOTE — Discharge Instructions (Signed)
DISCHARGE INSTRUCTIONS: D&C / D&E °The following instructions have been prepared to help you care for yourself upon your return home. °  °Personal hygiene: °• Use sanitary pads for vaginal drainage, not tampons. °• Shower the day after your procedure. °• NO tub baths, pools or Jacuzzis for 2-3 weeks. °• Wipe front to back after using the bathroom. ° °Activity and limitations: °• Do NOT drive or operate any equipment for 24 hours. The effects of anesthesia are still present and drowsiness may result. °• Do NOT rest in bed all day. °• Walking is encouraged. °• Walk up and down stairs slowly. °• You may resume your normal activity in one to two days or as indicated by your physician. ° °Sexual activity: NO intercourse for at least 2 weeks after the procedure, or as indicated by your physician. ° °Diet: Eat a light meal as desired this evening. You may resume your usual diet tomorrow. ° °Return to work: You may resume your work activities in one to two days or as indicated by your doctor. ° °What to expect after your surgery: Expect to have vaginal bleeding/discharge for 2-3 days and spotting for up to 10 days. It is not unusual to have soreness for up to 1-2 weeks. You may have a slight burning sensation when you urinate for the first day. Mild cramps may continue for a couple of days. You may have a regular period in 2-6 weeks. ° °Call your doctor for any of the following: °• Excessive vaginal bleeding, saturating and changing one pad every hour. °• Inability to urinate 6 hours after discharge from hospital. °• Pain not relieved by pain medication. °• Fever of 100.4° F or greater. °• Unusual vaginal discharge or odor. ° ° Call for an appointment:  ° ° °Patient’s signature: ______________________ ° °Nurse’s signature ________________________ ° °Support person's signature_______________________ ° ° °Hysteroscopy, Care After °Refer to this sheet in the next few weeks. These instructions provide you with information on  caring for yourself after your procedure. Your health care provider may also give you more specific instructions. Your treatment has been planned according to current medical practices, but problems sometimes occur. Call your health care provider if you have any problems or questions after your procedure. °What can I expect after the procedure? °After your procedure, it is typical to have the following: °· You may have some cramping. This normally lasts for a couple days. °· You may have bleeding. This can vary from light spotting for a few days to menstrual-like bleeding for 3-7 days. ° °Follow these instructions at home: °· Rest for the first 1-2 days after the procedure. °· Only take over-the-counter or prescription medicines as directed by your health care provider. Do not take aspirin. It can increase the chances of bleeding. °· Take showers instead of baths for 2 weeks or as directed by your health care provider. °· Do not drive for 24 hours or as directed. °· Do not drink alcohol while taking pain medicine. °· Do not use tampons, douche, or have sexual intercourse for 2 weeks or until your health care provider says it is okay. °· Take your temperature twice a day for 4-5 days. Write it down each time. °· Follow your health care provider's advice about diet, exercise, and lifting. °· If you develop constipation, you may: °? Take a mild laxative if your health care provider approves. °? Add bran foods to your diet. °? Drink enough fluids to keep your urine clear or pale yellow. °·   Try to have someone with you or available to you for the first 24-48 hours, especially if you were given a general anesthetic.  Follow up with your health care provider as directed. Contact a health care provider if:  You feel dizzy or lightheaded.  You feel sick to your stomach (nauseous).  You have abnormal vaginal discharge.  You have a rash.  You have pain that is not controlled with medicine. Get help right away  if:  You have bleeding that is heavier than a normal menstrual period.  You have a fever.  You have increasing cramps or pain, not controlled with medicine.  You have new belly (abdominal) pain.  You pass out.  You have pain in the tops of your shoulders (shoulder strap areas).  You have shortness of breath. This information is not intended to replace advice given to you by your health care provider. Make sure you discuss any questions you have with your health care provider. Document Released: 09/29/2013 Document Revised: 05/16/2016 Document Reviewed: 07/08/2013 Elsevier Interactive Patient Education  2017 [place]. Endometrial Ablation Endometrial ablation is a procedure that destroys the thin inner layer of the lining of the uterus (endometrium). This procedure may be done:  To stop heavy periods.  To stop bleeding that is causing anemia.  To control irregular bleeding.  To treat bleeding caused by small tumors (fibroids) in the endometrium.  This procedure is often an alternative to major surgery, such as removal of the uterus and cervix (hysterectomy). As a result of this procedure:  You may not be able to have children. However, if you are premenopausal (you have not gone through menopause): ? You may still have a small chance of getting pregnant. ? You will need to use a reliable method of birth control after the procedure to prevent pregnancy.  You may stop having a menstrual period, or you may have only a small amount of bleeding during your period. Menstruation may return several years after the procedure.  Tell a health care provider about:  Any allergies you have.  All medicines you are taking, including vitamins, herbs, eye drops, creams, and over-the-counter medicines.  Any problems you or family members have had with the use of anesthetic medicines.  Any blood disorders you have.  Any surgeries you have had.  Any medical conditions you have. What  are the risks? Generally, this is a safe procedure. However, problems may occur, including:  A hole (perforation) in the uterus or bowel.  Infection of the uterus, bladder, or vagina.  Bleeding.  Damage to other structures or organs.  An air bubble in the lung (air embolus).  Problems with pregnancy after the procedure.  Failure of the procedure.  Decreased ability to diagnose cancer in the endometrium.  What happens before the procedure?  You will have tests of your endometrium to make sure there are no pre-cancerous cells or cancer cells present.  You may have an ultrasound of the uterus.  You may be given medicines to thin the endometrium.  Ask your health care provider about: ? Changing or stopping your regular medicines. This is especially important if you take diabetes medicines or blood thinners. ? Taking medicines such as aspirin and ibuprofen. These medicines can thin your blood. Do not take these medicines before your procedure if your doctor tells you not to.  Plan to have someone take you home from the hospital or clinic. What happens during the procedure?  You will lie on an exam table  with your feet and legs supported as in a pelvic exam.  To lower your risk of infection: ? Your health care team will wash or sanitize their hands and put on germ-free (sterile) gloves. ? Your genital area will be washed with soap.  An IV tube will be inserted into one of your veins.  You will be given a medicine to help you relax (sedative).  A surgical instrument with a light and camera (resectoscope) will be inserted into your vagina and moved into your uterus. This allows your surgeon to see inside your uterus.  Endometrial tissue will be removed using one of the following methods: ? Radiofrequency. This method uses a radiofrequency-alternating electric current to remove the endometrium. ? Cryotherapy. This method uses extreme cold to freeze the  endometrium. ? Heated-free liquid. This method uses a heated saltwater (saline) solution to remove the endometrium. ? Microwave. This method uses high-energy microwaves to heat up the endometrium and remove it. ? Thermal balloon. This method involves inserting a catheter with a balloon tip into the uterus. The balloon tip is filled with heated fluid to remove the endometrium. The procedure may vary among health care providers and hospitals. What happens after the procedure?  Your blood pressure, heart rate, breathing rate, and blood oxygen level will be monitored until the medicines you were given have worn off.  As tissue healing occurs, you may notice vaginal bleeding for 4-6 weeks after the procedure. You may also experience: ? Cramps. ? Thin, watery vaginal discharge that is light pink or brown in color. ? A need to urinate more frequently than usual. ? Nausea.  Do not drive for 24 hours if you were given a sedative.  Do not have sex or insert anything into your vagina until your health care provider approves. Summary  Endometrial ablation is done to treat the many causes of heavy menstrual bleeding.  The procedure may be done only after medications have been tried to control the bleeding.  Plan to have someone take you home from the hospital or clinic. This information is not intended to replace advice given to you by your health care provider. Make sure you discuss any questions you have with your health care provider. Document Released: 10/18/2004 Document Revised: 12/26/2016 Document Reviewed: 12/26/2016 Elsevier Interactive Patient Education  2017 [employer].

## 2017-06-27 NOTE — Addendum Note (Signed)
Addendum  created 06/27/17 1419 by Asher Muir, CRNA   Anesthesia Event edited

## 2017-06-27 NOTE — Transfer of Care (Addendum)
Immediate Anesthesia Transfer of Care Note  Patient: Holly Hamilton  Procedure(s) Performed: Procedure(s): DILATATION & CURETTAGE/HYSTEROSCOPY WITH NOVASURE ABLATION WITH PAP SMEAR  Patient Location: PACU  Anesthesia Type:General  Level of Consciousness: awake  Airway & Oxygen Therapy: Patient Spontanous Breathing and Patient connected to nasal cannula oxygen  Post-op Assessment: Report given to RN  Post vital signs: Reviewed and stable  Last Vitals:  Vitals:   06/27/17 0734 06/27/17 0953  BP: (!) 137/97 (!) 141/70  Pulse: 69 (!) 120  Resp: 18   Temp: 36.8 C     Last Pain:  Vitals:   06/27/17 0734  TempSrc: Oral      Patients Stated Pain Goal: 4 (23/36/12 2449)  Complications: No apparent anesthesia complications . Pt congested, breathing 30 times per minute

## 2017-06-27 NOTE — Op Note (Signed)
06/27/2017  9:48 AM  PATIENT:  Holly Hamilton  46 y.o. female  PRE-OPERATIVE DIAGNOSIS:  dub  POST-OPERATIVE DIAGNOSIS:  dub  PROCEDURE:  Procedure(s): DILATATION & CURETTAGE/HYSTEROSCOPY WITH NOVASURE ABLATION WITH PAP SMEAR  SURGEON:  Surgeon(s) and Role:    * Lavonia Drafts, MD - Primary  ANESTHESIA:   paracervical block and MAC  EBL:  Total I/O In: 1000 [I.V.:1000] Out: 10 [Urine:10]  BLOOD ADMINISTERED:none  DRAINS: none   LOCAL MEDICATIONS USED:  MARCAINE     SPECIMEN:  Source of Specimen:  endometrial currettings.   DISPOSITION OF SPECIMEN:  PATHOLOGY  COUNTS:  YES  TOURNIQUET:  * No tourniquets in log *  DICTATION: .Note written in [place]: Discharge to home after PACU  PATIENT DISPOSITION:  PACU - hemodynamically stable.   Delay start of Pharmacological VTE agent (>24hrs) due to surgical blood loss or risk of bleeding: yes  Complications: none immediate   The risks, benefits, and alternatives of surgery were explained, understood, and accepted. The consents were signed and all questions were answered. Pt was taken to the operating room and general anesthesia with LMA was applied without complication. She was placed in the dorsal lithotomy position and her vagina and abdomen were prepped and draped in the usual sterile fashion. A bimanual exam revealed a 14-16 weeks sized anteverted mobile uterus. Her adnexa were non-enlarged.    A bivalved speculum was placed in the patients' vagina and the anterior lip of the cervix was grasped with a single toothed tenaculum. A paracervical block was performed at 5 and 7 o'clock with 20cc of 0.5% Marc[place].   The endometrial cavity was sounded to 11cm and the endocervical length measured 3cm. A hysteroscope was inserted and the endometrium was noted to have several large blood clots that could not be removed using the curette or forceps.  The scope was removed and a sharp currete was used  to scape the lining of the uterus until a gritty texture was noted throughout. The Myosure device was used to remove the clots as the [place] reported that a large fibroid or uterine mass was noted.   After removal of the clots The ostia on both sides were noted.   No uterine mass was appreciated. Specimens were sent to pathology.  The NovaSure device was then inserted and seated using 6.5cm as the cavity length and 3.5 as the cavity width.  The total activation time was 57 sec at a power of 125.  The hysteroscope was reinserted and an even burn pattern was noted to the fundus.  The single toothed tenaculum was removed at the end of the case and no bleeding was noted from the cervix.   The patient was extubated and taken to the recovery room in stable condition.  Sponge, lap and instrument counts were correct.  There were no complications.  Pt will be discharged to home after her PACU stay.    Duncan Alejandro L. Harraway-Smith, M.D., [facility]

## 2017-06-27 NOTE — Addendum Note (Signed)
Addendum  created 06/27/17 1333 by Asher Muir, CRNA   Anesthesia Event edited, Anesthesia Intra Blocks edited, Child order released for a procedure order, LDA created via procedure documentation, Sign clinical note

## 2017-06-27 NOTE — Progress Notes (Signed)
Pt arrived to PACU on L side, tachypnea noted. Coarse breath sounds bilaterally, pt 77% on 4L [place] upon arrival to PACU. Pt placed on NRB @ 15L, tol well.

## 2017-06-27 NOTE — H&P (View-Only) (Signed)
History and Physical  Holly Hamilton is a 46 y.o. G3P3 who presents with c/o dizziness and extreme weakness while at work earlier this am. Pt had Hgb of 4 in the [place]. Pt reports that she has been bleeding for 3 weeks and reports that on last night she wok up in a pool of blood and had blood running down her legs and on to the floor. Was never told that she had fibroids.  Last GYN visit unknown.  Pt has not been to a GYN provider for many years. Pt reports that with the bleeding sh ehas also been having pain.  She has been taking her husbands pain meds.     CC: active vaginal bleeding  Past Medical History:  Diagnosis Date  . Anemia   . Grave's disease   . 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 THYROIDECTOMY     OB History    No data available     Patient denies any cervical dysplasia or STIs. Prescriptions Prior to Admission  Medication Sig Dispense Refill Last Dose  . hydrochlorothiazide (HYDRODIURIL) 25 MG tablet Take 25 mg by mouth daily.   06/18/2017 at Unknown time  . levothyroxine (SYNTHROID, LEVOTHROID) 175 MCG tablet Take 175 mcg by mouth daily before breakfast.   06/18/2017 at Unknown time  . benzonatate (TESSALON) 100 MG capsule Take 2 capsules (200 mg total) by mouth 2 (two) times daily as needed for cough. (Patient not taking: Reported on 06/19/2017) 20 capsule 0 Completed Course at Unknown time  . oxymetazoline (AFRIN NASAL SPRAY) 0.05 % nasal spray Place 1 spray into both nostrils 2 (two) times daily. Spray once into each nostril twice daily for up to the next 3 days. Do not use for more than 3 days to prevent rebound rhinorrhea. (Patient not taking: Reported on 06/19/2017) 30 mL 0 Completed Course at Unknown time    No Known Allergies Social History:   reports that she has never smoked. She does not have any smokeless tobacco history on file. She reports that she does not drink  alcohol or use drugs. History reviewed. No pertinent family history.  Review of Systems: Noncontributory  PHYSICAL EXAM: Blood pressure 116/78, pulse 84, temperature 98.4 F (36.9 C), resp. rate 18, SpO2 100 %. General appearance - alert, well appearing, and in no distress. When I initially saw pt she was en route to the BR with blood running down her legs.  Chest - clear to auscultation, no wheezes, rales or rhonchi, symmetric air entry Heart - normal rate and regular rhythm Abdomen - soft, nontender, nondistended, no masses or organomegaly Pelvic - examination deferred Extremities - peripheral pulses normal, no pedal edema, no clubbing or cyanosis; pt has very pale nail beds.   Labs: Results for orders placed or performed during the hospital encounter of 06/19/17 (from the past 336 hour(s))  Comprehensive metabolic panel   Collection Time: 06/19/17  4:48 AM  Result Value Ref Range   Sodium 137 135 - 145 mmol/L   Potassium 3.1 (L) 3.5 - 5.1 mmol/L   Chloride 104 101 - 111 mmol/L   CO2 24 22 - 32 mmol/L   Glucose, Bld 107 (H) 65 - 99 mg/dL   BUN 14 6 - 20 mg/dL   Creatinine, Ser 0.83 0.44 - 1.00 mg/dL   Calcium 8.5 (L) 8.9 - 10.3 mg/dL   Total Protein 6.9 6.5 - 8.1 g/dL  Albumin 3.1 (L) 3.5 - 5.0 g/dL   AST 27 15 - 41 U/L   ALT 15 14 - 54 U/L   Alkaline Phosphatase 56 38 - 126 U/L   Total Bilirubin 0.2 (L) 0.3 - 1.2 mg/dL   GFR calc non Af Amer >60 >60 mL/min   GFR calc Af Amer >60 >60 mL/min   Anion gap 9 5 - 15  CBC with Differential   Collection Time: 06/19/17  4:48 AM  Result Value Ref Range   WBC 9.3 4.0 - 10.5 K/uL   RBC 2.82 (L) 3.87 - 5.11 MIL/uL   Hemoglobin 4.4 (LL) 12.0 - 15.0 g/dL   HCT 16.9 (L) 36.0 - 46.0 %   MCV 59.9 (L) 78.0 - 100.0 fL   MCH 15.6 (L) 26.0 - 34.0 pg   MCHC 26.0 (L) 30.0 - 36.0 g/dL   RDW 29.7 (H) 11.5 - 15.5 %   Platelets 833 (H) 150 - 400 K/uL   Neutrophils Relative % 80 %   Lymphocytes Relative 14 %   Monocytes Relative 5 %    Eosinophils Relative 1 %   Basophils Relative 0 %   Neutro Abs 7.4 1.7 - 7.7 K/uL   Lymphs Abs 1.3 0.7 - 4.0 K/uL   Monocytes Absolute 0.5 0.1 - 1.0 K/uL   Eosinophils Absolute 0.1 0.0 - 0.7 K/uL   Basophils Absolute 0.0 0.0 - 0.1 K/uL   RBC Morphology POLYCHROMASIA PRESENT   Prepare RBC   Collection Time: 06/19/17  6:41 AM  Result Value Ref Range   Order Confirmation ORDER PROCESSED BY BLOOD BANK   Type and screen [place]   Collection Time: 06/19/17  6:51 AM  Result Value Ref Range   ABO/RH(D) O POS    Antibody Screen NEG    Sample Expiration 06/22/2017    Unit Number [id number]    Blood Component Type RBC, LR IRR    Unit division 00    Status of Unit ISSUED    Transfusion Status OK TO TRANSFUSE    Crossmatch Result Compatible    Unit Number [id number]    Blood Component Type RBC LR PHER1    Unit division 00    Status of Unit ALLOCATED    Transfusion Status OK TO TRANSFUSE    Crossmatch Result Compatible   ABO/Rh   Collection Time: 06/19/17  6:51 AM  Result Value Ref Range   ABO/RH(D) O POS   BPAM RBC   Collection Time: 06/19/17  6:51 AM  Result Value Ref Range   ISSUE DATE / TIME [id number]    Blood Product Unit Number [id number]    PRODUCT CODE E0332V00    Unit Type and Rh 9500    Blood Product Expiration Date 016010932355    ISSUE DATE / TIME [id number]    Blood Product Unit Number [id number]    PRODUCT CODE Y0737T06    Unit Type and Rh 9500    Blood Product Expiration Date [id number]   Type and screen [place]   Collection Time: 06/19/17 10:05 AM  Result Value Ref Range   ABO/RH(D) O POS    Antibody Screen NEG    Sample Expiration 06/22/2017   TSH   Collection Time: 06/19/17 10:06 AM  Result Value Ref Range   TSH 0.224 (L) 0.350 - 4.500 uIU/mL  Prepare RBC   Collection Time: 06/19/17 10:06 AM  Result Value Ref Range   Order Confirmation ORDER PROCESSED BY BLOOD BANK   ABO/Rh  Collection  Time: 06/19/17 10:15 AM  Result Value Ref Range   ABO/RH(D) O POS     Imaging Studies: US Transvaginal Non-ob  Result Date: 06/19/2017 CLINICAL DATA:  46 year old female with abnormal uterine bleeding for 3 weeks and lower abdominal pain. Anemia. LMP 05/22/2017. EXAM: TRANSABDOMINAL AND TRANSVAGINAL ULTRASOUND OF PELVIS TECHNIQUE: Both transabdominal and transvaginal ultrasound examinations of the pelvis were performed. Transabdominal technique was performed for global imaging of the pelvis including uterus, ovaries, adnexal regions, and pelvic cul-de-sac. It was necessary to proceed with endovaginal exam following the transabdominal exam to visualize the endometrium, myometrium and adnexa. COMPARISON:  None FINDINGS: Uterus Measurements: 12.5 x 7.0 x 7.7 cm. Enlarged anteverted myomatous uterus, with representative fibroids as follows: - left posterior fundal 4.8 x 4.1 x 4.6 cm subserosal fibroid - right anterior uterine body intramural 3.6 x 2.5 x 3.6 cm fibroid - posterior right uterine body intramural 1.5 x 1.2 x 1.6 cm fibroid - right anterior uterine body intramural 2.1 x 2.1 x 1.7 cm fibroid Endometrium There is a large 4.6 x 3.6 x 3.9 cm mass distending the endometrial cavity, which demonstrates heterogeneous echogenicity and internal vascularity on color Doppler. Right ovary Measurements: 3.3 x 1.9 x 2.0 cm. Normal appearance/no adnexal mass. Left ovary Measurements: 3.1 x 1.9 x 2.3 cm. Normal appearance/no adnexal mass. Other findings No abnormal free fluid. IMPRESSION: 1. Enlarged anteverted myomatous uterus as detailed . 2. Large 4.6 x 3.6 x 3.9 cm heterogeneous solid mass distending the endometrial cavity, most likely to represent an intracavitary fibroid. Endometrial malignancy is less likely but not excluded. Consider hysteroscopic evaluation with directed tissue sampling. Saline infusion hysterosonography could be obtained for further characterization, as clinically warranted. 3. Normal  ovaries.  No adnexal masses. Electronically Signed   By: Ilona Sorrel M.D.   On: 06/19/2017 11:12   US Pelvis Complete  Result Date: 06/19/2017 CLINICAL DATA:  46 year old female with abnormal uterine bleeding for 3 weeks and lower abdominal pain. Anemia. LMP 05/22/2017. EXAM: TRANSABDOMINAL AND TRANSVAGINAL ULTRASOUND OF PELVIS TECHNIQUE: Both transabdominal and transvaginal ultrasound examinations of the pelvis were performed. Transabdominal technique was performed for global imaging of the pelvis including uterus, ovaries, adnexal regions, and pelvic cul-de-sac. It was necessary to proceed with endovaginal exam following the transabdominal exam to visualize the endometrium, myometrium and adnexa. COMPARISON:  None FINDINGS: Uterus Measurements: 12.5 x 7.0 x 7.7 cm. Enlarged anteverted myomatous uterus, with representative fibroids as follows: - left posterior fundal 4.8 x 4.1 x 4.6 cm subserosal fibroid - right anterior uterine body intramural 3.6 x 2.5 x 3.6 cm fibroid - posterior right uterine body intramural 1.5 x 1.2 x 1.6 cm fibroid - right anterior uterine body intramural 2.1 x 2.1 x 1.7 cm fibroid Endometrium There is a large 4.6 x 3.6 x 3.9 cm mass distending the endometrial cavity, which demonstrates heterogeneous echogenicity and internal vascularity on color Doppler. Right ovary Measurements: 3.3 x 1.9 x 2.0 cm. Normal appearance/no adnexal mass. Left ovary Measurements: 3.1 x 1.9 x 2.3 cm. Normal appearance/no adnexal mass. Other findings No abnormal free fluid. IMPRESSION: 1. Enlarged anteverted myomatous uterus as detailed . 2. Large 4.6 x 3.6 x 3.9 cm heterogeneous solid mass distending the endometrial cavity, most likely to represent an intracavitary fibroid. Endometrial malignancy is less likely but not excluded. Consider hysteroscopic evaluation with directed tissue sampling. Saline infusion hysterosonography could be obtained for further characterization, as clinically warranted. 3. Normal  ovaries.  No adnexal masses. Electronically Signed   By: Corene Cornea  A Poff M.D.   On: 06/19/2017 11:12    Assessment: Patient Active Problem List   Diagnosis Date Noted  . Symptomatic anemia 06/19/2017  . Abnormal uterine bleeding (AUB) 06/19/2017  . Fibroids 06/19/2017  EES contraindicated due to carotid aa blockage.  Plan: Admit Cont transfusion of PRBCs- 3 units total CBC after transfusion Increase Synthroid from 175 to 261mcg daily Reg diet Pelvic US Megace 80bid for 2 days Scheduled hysteroscopy with D&C and resection of fibroids with PAP      Reyonna Haack L. Ihor Dow, M.D., [facility] 06/19/2017 11:37 AM

## 2017-06-27 NOTE — Anesthesia Preprocedure Evaluation (Addendum)
Anesthesia Evaluation  Patient identified by MRN, date of birth, ID band Patient awake    Airway Mallampati: I       Dental no notable dental hx. (+) Teeth Intact   Pulmonary neg pulmonary ROS,    Pulmonary exam normal breath sounds clear to auscultation       Cardiovascular hypertension, Normal cardiovascular exam Rhythm:Regular Rate:Normal     Neuro/Psych  Headaches, negative neurological ROS  negative psych ROS   GI/Hepatic negative GI ROS, Neg liver ROS,   Endo/Other  Hyperthyroidism   Renal/GU negative Renal ROS     Musculoskeletal negative musculoskeletal ROS (+)   Abdominal Normal abdominal exam  (+)   Peds  Hematology  (+) Blood dyscrasia, anemia ,   Anesthesia Other Findings   Reproductive/Obstetrics                            Anesthesia Physical Anesthesia Plan  ASA: III  Anesthesia Plan: General   Post-op Pain Management:    Induction: Intravenous  PONV Risk Score and Plan: 4 or greater and Ondansetron, Dexamethasone, Propofol and Midazolam  Airway Management Planned: LMA  Additional Equipment:   Intra-op Plan:   Post-operative Plan: Extubation in OR  Informed Consent: I have reviewed the patients History and Physical, chart, labs and discussed the procedure including the risks, benefits and alternatives for the proposed anesthesia with the patient or authorized representative who has indicated his/her understanding and acceptance.   Consent reviewed with POA and Dental advisory given  Plan Discussed with: CRNA, Surgeon and Anesthesiologist  Anesthesia Plan Comments:       Anesthesia Quick Evaluation

## 2017-06-27 NOTE — Anesthesia Postprocedure Evaluation (Addendum)
Anesthesia Post Note  Patient: Holly Hamilton  Procedure(s) Performed: Procedure(s): DILATATION & CURETTAGE/HYSTEROSCOPY WITH NOVASURE ABLATION WITH PAP SMEAR     Patient location during evaluation: PACU Anesthesia Type: General Level of consciousness: awake Pain management: pain level controlled Vital Signs Assessment: post-procedure vital signs reviewed and stable Respiratory status: spontaneous breathing Cardiovascular status: stable Postop Assessment: no signs of nausea or vomiting Anesthetic complications: no Comments: The patient experienced an unknown event during her procedure which necessitated the need to administer 20 mg IV of Lasix for pulmonary edema. By the time of her discharge the patient no longer was coughing pink fluid and could maintain normal oxygen saturations on room air.    Last Vitals:  Vitals:   06/27/17 1200 06/27/17 1225  BP: 135/76 124/79  Pulse: 73 81  Resp: (!) 22 18  Temp:      Last Pain:  Vitals:   06/27/17 1115  TempSrc:   PainSc: Asleep   Pain Goal: Patients Stated Pain Goal: 4 (06/27/17 0734)               Christofer Shen JR,JOHN Mateo Flow

## 2017-06-27 NOTE — Brief Op Note (Signed)
06/27/2017  9:48 AM  PATIENT:  Holly Hamilton  46 y.o. female  PRE-OPERATIVE DIAGNOSIS:  dub  POST-OPERATIVE DIAGNOSIS:  dub  PROCEDURE:  Procedure(s): DILATATION & CURETTAGE/HYSTEROSCOPY WITH NOVASURE ABLATION WITH PAP SMEAR  SURGEON:  Surgeon(s) and Role:    * Lavonia Drafts, MD - Primary  ANESTHESIA:   paracervical block and MAC  EBL:  Total I/O In: 1000 [I.V.:1000] Out: 10 [Urine:10]  BLOOD ADMINISTERED:none  DRAINS: none   LOCAL MEDICATIONS USED:  MARCAINE     SPECIMEN:  Source of Specimen:  endometrial currettings.   DISPOSITION OF SPECIMEN:  PATHOLOGY  COUNTS:  YES  TOURNIQUET:  * No tourniquets in log *  DICTATION: .Note written in [place]: Discharge to home after PACU  PATIENT DISPOSITION:  PACU - hemodynamically stable.   Delay start of Pharmacological VTE agent (>24hrs) due to surgical blood loss or risk of bleeding: yes  Complications: none immediate  Zamar Odwyer L. Harraway-Smith, M.D., Cherlynn June

## 2017-06-27 NOTE — Anesthesia Procedure Notes (Signed)
Procedure Name: LMA Insertion Date/Time: 06/27/2017 8:41 AM Performed by: Casimer Lanius A Pre-anesthesia Checklist: Patient being monitored, Patient identified, Emergency Drugs available and Suction available Patient Re-evaluated:Patient Re-evaluated prior to inductionOxygen Delivery Method: Circle system utilized Preoxygenation: Pre-oxygenation with 100% oxygen Intubation Type: IV induction and Inhalational induction Ventilation: Mask ventilation without difficulty LMA: LMA inserted LMA Size: 4.0 Number of attempts: 1 Dental Injury: Teeth and Oropharynx as per pre-operative assessment

## 2017-06-27 NOTE — Interval H&P Note (Signed)
History and Physical Interval Note:  06/27/2017 8:25 AM  Lashell Slinger  has presented today for surgery, with the diagnosis of dub  The various methods of treatment have been discussed with the patient and family. After consideration of risks, benefits and other options for treatment, the patient has consented to  Procedure(s) with comments: [place] (N/A) - [place]  as a surgical intervention .  The patient's history has been reviewed, patient examined, no change in status, stable for surgery.  I have reviewed the patient's chart and labs.  Questions were answered to the patient's satisfaction.     Ezell Poke Harraway-Smith  CBC    Component Value Date/Time   WBC 9.0 06/20/2017 0729   RBC 4.58 06/20/2017 0729   HGB 9.6 (L) 06/20/2017 0729   HCT 31.6 (L) 06/20/2017 0729   PLT 844 (H) 06/20/2017 0729   MCV 69.0 (L) 06/20/2017 0729   MCH 21.0 (L) 06/20/2017 0729   MCHC 30.4 06/20/2017 0729   RDW 29.3 (H) 06/20/2017 0729   LYMPHSABS 2.4 06/19/2017 1006   MONOABS 0.3 06/19/2017 1006   EOSABS 0.0 06/19/2017 1006   BASOSABS 0.0 06/19/2017 1006   NO changes.  Anees Vanecek L. Harraway-Smith, M.D., Cherlynn June

## 2017-06-28 ENCOUNTER — Encounter (HOSPITAL_COMMUNITY): Payer: Self-pay | Admitting: Obstetrics & Gynecology

## 2017-06-30 LAB — CYTOLOGY - PAP: Diagnosis: NEGATIVE

## 2017-07-03 ENCOUNTER — Inpatient Hospital Stay (HOSPITAL_COMMUNITY)
Admission: AD | Admit: 2017-07-03 | Discharge: 2017-07-03 | Disposition: A | Discharge status: Home / Self Care | Payer: Self-pay | Source: Ambulatory Visit | Attending: Obstetrics & Gynecology | Admitting: Obstetrics & Gynecology

## 2017-07-03 DIAGNOSIS — N939 Abnormal uterine and vaginal bleeding, unspecified: Secondary | ICD-10-CM | POA: Insufficient documentation

## 2017-07-03 DIAGNOSIS — I10 Essential (primary) hypertension: Secondary | ICD-10-CM | POA: Insufficient documentation

## 2017-07-03 DIAGNOSIS — N99820 Postprocedural hemorrhage and hematoma of a genitourinary system organ or structure following a genitourinary system procedure: Secondary | ICD-10-CM

## 2017-07-03 LAB — CBC WITH DIFFERENTIAL/PLATELET
Basophils Absolute: 0 10*3/uL (ref 0.0–0.1)
Basophils Relative: 0 %
Eosinophils Absolute: 0.2 10*3/uL (ref 0.0–0.7)
Eosinophils Relative: 2 %
HEMATOCRIT: 28.6 % — AB (ref 36.0–46.0)
HEMOGLOBIN: 8.9 g/dL — AB (ref 12.0–15.0)
LYMPHS ABS: 2.6 10*3/uL (ref 0.7–4.0)
LYMPHS PCT: 21 %
MCH: 22.5 pg — AB (ref 26.0–34.0)
MCHC: 31.1 g/dL (ref 30.0–36.0)
MCV: 72.4 fL — ABNORMAL LOW (ref 78.0–100.0)
MONOS PCT: 4 %
Monocytes Absolute: 0.4 10*3/uL (ref 0.1–1.0)
NEUTROS ABS: 8.9 10*3/uL — AB (ref 1.7–7.7)
Neutrophils Relative %: 73 %
Platelets: 148 10*3/uL — ABNORMAL LOW (ref 150–400)
RBC: 3.95 MIL/uL (ref 3.87–5.11)
WBC: 12.2 10*3/uL — ABNORMAL HIGH (ref 4.0–10.5)

## 2017-07-03 MED ORDER — KETOROLAC TROMETHAMINE 60 MG/2ML IM SOLN
60.0000 mg | Freq: Once | INTRAMUSCULAR | Status: AC
  Administered 2017-07-03: 60 mg via INTRAMUSCULAR
  Filled 2017-07-03: qty 2

## 2017-07-03 MED ORDER — OXYCODONE-ACETAMINOPHEN 5-325 MG PO TABS: 1.0000 | ORAL_TABLET | Freq: Four times a day (QID) | ORAL | 0 refills | Status: DC | PRN

## 2017-07-03 NOTE — MAU Note (Signed)
Urine in lab 

## 2017-07-03 NOTE — MAU Provider Note (Signed)
History     CSN: [id number]  Arrival date and time: 07/03/17 4854   First Provider Initiated Contact with Patient 07/03/17 [phone]      Chief Complaint  Patient presents with  . Abdominal Pain  . Vaginal Bleeding   HPI Holly Hamilton is a 46 y.o. non pregnant female who presents with vaginal bleeding and lower abdominal cramping that started last night. She rates the pain a 8/10 and has tried percocet and ibuprofen for the pain with no relief. She said the vaginal bleeding started after and is heavier than her normal period. She was admitted on 6/28 with a hemoglobin of 4.4 and was transfused 3 units, then had a D&C with ablation on 7/6. She reports no problems since then until last night when the pain and bleeding started.  OB History    No data available      Past Medical History:  Diagnosis Date  . Anemia   . Grave's disease   . Headache   . Hypertension     Past Surgical History:  Procedure Laterality Date  . DILITATION & CURRETTAGE/HYSTROSCOPY WITH NOVASURE ABLATION  06/27/2017   Procedure: DILATATION & CURETTAGE/HYSTEROSCOPY WITH NOVASURE ABLATION WITH PAP SMEAR;  Surgeon: Lavonia Drafts, MD;  Location: [facility];  Service: Gynecology;;  . THYROIDECTOMY      No family history on file.  Social History  Substance Use Topics  . Smoking status: Never Smoker  . Smokeless tobacco: Never Used  . Alcohol use No    Allergies: No Known Allergies  Prescriptions Prior to Admission  Medication Sig Dispense Refill Last Dose  . ferrous sulfate (FERROUSUL) 325 (65 FE) MG tablet Take 1 tablet (325 mg total) by mouth 2 (two) times daily. 60 tablet 1 07/02/2017 at Unknown time  . hydrochlorothiazide (HYDRODIURIL) 25 MG tablet Take 25 mg by mouth daily.   07/02/2017 at Unknown time  . ibuprofen (ADVIL,MOTRIN) 800 MG tablet Take 1 tablet (800 mg total) by mouth every 8 (eight) hours as needed. 30 tablet 0 07/02/2017 at Unknown time  . levothyroxine (SYNTHROID, LEVOTHROID) 112 MCG  tablet Take 1.5 tablets (168 mcg total) by mouth daily before breakfast. 30 tablet 1 07/02/2017 at Unknown time  . megestrol (MEGACE) 40 MG tablet Take 1 tablet (40 mg total) by mouth 2 (two) times daily. 60 tablet 0 07/02/2017 at Unknown time  . oxyCODONE-acetaminophen (PERCOCET/ROXICET) 5-325 MG tablet Take 1-2 tablets by mouth every 6 (six) hours as needed for moderate pain or severe pain. 15 tablet 0 07/02/2017 at Unknown time    Review of Systems  Constitutional: Negative.  Negative for chills and fever.  HENT: Negative.   Respiratory: Negative.  Negative for shortness of breath.   Cardiovascular: Negative.  Negative for chest pain.  Gastrointestinal: Positive for abdominal pain. Negative for constipation, diarrhea, nausea and vomiting.  Genitourinary: Positive for vaginal bleeding. Negative for dysuria and vaginal discharge.  Neurological: Negative.  Negative for dizziness, light-headedness and headaches.  Psychiatric/Behavioral: Negative.    Physical Exam   Blood pressure 126/76, pulse 98, temperature 98.6 F (37 C), temperature source Oral, resp. rate 18, height 5\' 4"  (1.626 m), weight 174 lb (78.9 kg).  Physical Exam  Nursing note and vitals reviewed. Constitutional: She appears well-developed and well-nourished.  HENT:  Head: Normocephalic and atraumatic.  Eyes: Conjunctivae are normal. No scleral icterus.  Cardiovascular: Normal rate, regular rhythm and normal heart sounds.   Respiratory: Effort normal and breath sounds normal. No respiratory distress.  GI: Soft. She exhibits  no distension. There is no tenderness.  Genitourinary: Uterus is tender. Cervix exhibits no motion tenderness. Right adnexum displays tenderness. Left adnexum displays tenderness. There is bleeding in the vagina. No vaginal discharge found.  Genitourinary Comments: Small amount of dark red blood in vault.  Neurological: She is alert.  Skin: Skin is warm and dry.  Psychiatric: She has a normal mood and  affect. Her behavior is normal. Judgment and thought content normal.    MAU Course  Procedures Results for orders placed or performed during the hospital encounter of 07/03/17 (from the past 24 hour(s))  CBC with Differential/Platelet     Status: Abnormal   Collection Time: 07/03/17  8:56 AM  Result Value Ref Range   WBC 12.2 (H) 4.0 - 10.5 K/uL   RBC 3.95 3.87 - 5.11 MIL/uL   Hemoglobin 8.9 (L) 12.0 - 15.0 g/dL   HCT 28.6 (L) 36.0 - 46.0 %   MCV 72.4 (L) 78.0 - 100.0 fL   MCH 22.5 (L) 26.0 - 34.0 pg   MCHC 31.1 30.0 - 36.0 g/dL   RDW NOT CALCULATED 11.5 - 15.5 %   Platelets 148 (L) 150 - 400 K/uL   Neutrophils Relative % 73 %   Neutro Abs 8.9 (H) 1.7 - 7.7 K/uL   Lymphocytes Relative 21 %   Lymphs Abs 2.6 0.7 - 4.0 K/uL   Monocytes Relative 4 %   Monocytes Absolute 0.4 0.1 - 1.0 K/uL   Eosinophils Relative 2 %   Eosinophils Absolute 0.2 0.0 - 0.7 K/uL   Basophils Relative 0 %   Basophils Absolute 0.0 0.0 - 0.1 K/uL   MDM CBC with Diff Toradol 60mg  IM- patient reports relief from pain Discussed with Dr. Ihor Dow- continue Megace and ok to discharge home Assessment and Plan   1. Postoperative vaginal bleeding following genitourinary procedure    -Discharge patient home in stable condition -Continue megace 40mg  BID -[facility] 07/21/17 as scheduled -Bleeding and pain precautions reviewed -Encouraged to return here or to other Urgent Care/ED if she develops worsening of symptoms, increase in pain, fever, or other concerning symptoms.   Len Blalock SNM 07/03/2017, 9:33 AM   The patient was seen and examined by me also Agree with note Bleeding is currently light, no active hemorrhage Hemoglobin stable Ready for discharge Will have her followup in clinic as scheduled for postop appt Rx Percocet for prn use this week Seabron Spates, CNM

## 2017-07-03 NOTE — Discharge Instructions (Signed)
Abnormal Uterine Bleeding Abnormal uterine bleeding can affect women at various stages in life, including teenagers, women in their reproductive years, pregnant women, and women who have reached menopause. Several kinds of uterine bleeding are considered abnormal, including:  Bleeding or spotting between periods.  Bleeding after sexual intercourse.  Bleeding that is heavier or more than normal.  Periods that last longer than usual.  Bleeding after menopause. Many cases of abnormal uterine bleeding are minor and simple to treat, while others are more serious. Any type of abnormal bleeding should be evaluated by your health care provider. Treatment will depend on the cause of the bleeding. Follow these instructions at home: Monitor your condition for any changes. The following actions may help to alleviate any discomfort you are experiencing:  Avoid the use of tampons and douches as directed by your health care provider.  Change your pads frequently. You should get regular pelvic exams and Pap tests. Keep all follow-up appointments for diagnostic tests as directed by your health care provider. Contact a health care provider if:  Your bleeding lasts more than 1 week.  You feel dizzy at times. Get help right away if:  You pass out.  You are changing pads every 15 to 30 minutes.  You have abdominal pain.  You have a fever.  You become sweaty or weak.  You are passing large blood clots from the vagina.  You start to feel nauseous and vomit. This information is not intended to replace advice given to you by your health care provider. Make sure you discuss any questions you have with your health care provider. Document Released: 12/09/2005 Document Revised: 05/22/2016 Document Reviewed: 07/08/2013 Elsevier Interactive Patient Education  2017 Elsevier Inc.  

## 2017-07-03 NOTE — MAU Note (Signed)
Pt had surgery on Friday to "remove clots". Started having increased abd pain and vaginal bleeding yesterday.

## 2017-07-21 ENCOUNTER — Ambulatory Visit (INDEPENDENT_AMBULATORY_CARE_PROVIDER_SITE_OTHER): Payer: Self-pay | Admitting: Obstetrics & Gynecology

## 2017-07-21 VITALS — BP 136/84 | HR 84 | Wt 172.3 lb

## 2017-07-21 DIAGNOSIS — N939 Abnormal uterine and vaginal bleeding, unspecified: Secondary | ICD-10-CM

## 2017-07-21 MED ORDER — MEGESTROL ACETATE 40 MG PO TABS: 40.0000 mg | ORAL_TABLET | Freq: Two times a day (BID) | ORAL | 2 refills | Status: DC

## 2017-07-21 NOTE — Patient Instructions (Signed)
Endometrial Ablation Endometrial ablation is a procedure that destroys the thin inner layer of the lining of the uterus (endometrium). This procedure may be done:  To stop heavy periods.  To stop bleeding that is causing anemia.  To control irregular bleeding.  To treat bleeding caused by small tumors (fibroids) in the endometrium.  This procedure is often an alternative to major surgery, such as removal of the uterus and cervix (hysterectomy). As a result of this procedure:  You may not be able to have children. However, if you are premenopausal (you have not gone through menopause): ? You may still have a small chance of getting pregnant. ? You will need to use a reliable method of birth control after the procedure to prevent pregnancy.  You may stop having a menstrual period, or you may have only a small amount of bleeding during your period. Menstruation may return several years after the procedure.  Tell a health care provider about:  Any allergies you have.  All medicines you are taking, including vitamins, herbs, eye drops, creams, and over-the-counter medicines.  Any problems you or family members have had with the use of anesthetic medicines.  Any blood disorders you have.  Any surgeries you have had.  Any medical conditions you have. What are the risks? Generally, this is a safe procedure. However, problems may occur, including:  A hole (perforation) in the uterus or bowel.  Infection of the uterus, bladder, or vagina.  Bleeding.  Damage to other structures or organs.  An air bubble in the lung (air embolus).  Problems with pregnancy after the procedure.  Failure of the procedure.  Decreased ability to diagnose cancer in the endometrium.  What happens before the procedure?  You will have tests of your endometrium to make sure there are no pre-cancerous cells or cancer cells present.  You may have an ultrasound of the uterus.  You may be given  medicines to thin the endometrium.  Ask your health care provider about: ? Changing or stopping your regular medicines. This is especially important if you take diabetes medicines or blood thinners. ? Taking medicines such as aspirin and ibuprofen. These medicines can thin your blood. Do not take these medicines before your procedure if your doctor tells you not to.  Plan to have someone take you home from the hospital or clinic. What happens during the procedure?  You will lie on an exam table with your feet and legs supported as in a pelvic exam.  To lower your risk of infection: ? Your health care team will wash or sanitize their hands and put on germ-free (sterile) gloves. ? Your genital area will be washed with soap.  An IV tube will be inserted into one of your veins.  You will be given a medicine to help you relax (sedative).  A surgical instrument with a light and camera (resectoscope) will be inserted into your vagina and moved into your uterus. This allows your surgeon to see inside your uterus.  Endometrial tissue will be removed using one of the following methods: ? Radiofrequency. This method uses a radiofrequency-alternating electric current to remove the endometrium. ? Cryotherapy. This method uses extreme cold to freeze the endometrium. ? Heated-free liquid. This method uses a heated saltwater (saline) solution to remove the endometrium. ? Microwave. This method uses high-energy microwaves to heat up the endometrium and remove it. ? Thermal balloon. This method involves inserting a catheter with a balloon tip into the uterus. The balloon tip is   filled with heated fluid to remove the endometrium. The procedure may vary among health care providers and hospitals. What happens after the procedure?  Your blood pressure, heart rate, breathing rate, and blood oxygen level will be monitored until the medicines you were given have worn off.  As tissue healing occurs, you may  notice vaginal bleeding for 4-6 weeks after the procedure. You may also experience: ? Cramps. ? Thin, watery vaginal discharge that is light pink or brown in color. ? A need to urinate more frequently than usual. ? Nausea.  Do not drive for 24 hours if you were given a sedative.  Do not have sex or insert anything into your vagina until your health care provider approves. Summary  Endometrial ablation is done to treat the many causes of heavy menstrual bleeding.  The procedure may be done only after medications have been tried to control the bleeding.  Plan to have someone take you home from the hospital or clinic. This information is not intended to replace advice given to you by your health care provider. Make sure you discuss any questions you have with your health care provider. Document Released: 10/18/2004 Document Revised: 12/26/2016 Document Reviewed: 12/26/2016 Elsevier Interactive Patient Education  2017 Elsevier Inc.  

## 2017-07-21 NOTE — Progress Notes (Signed)
History:  46 y.o. Pt presents for her 4 week post op check after an endometrial ablation. She reports light bleeding. She changes 2 light pads per day. She reports going ot the ED after the procedure due to pelvic pain. She does report 1 day of heavy bleeding since the procedure.   The following portions of the patient's history were reviewed and updated as appropriate: allergies, current medications, past family history, past medical history, past social history, past surgical history and problem list.  Review of Systems:  Pertinent items are noted in HPI.   Objective:  Physical Exam Blood pressure 136/84, pulse 84, weight 172 lb 4.8 oz (78.2 kg).  CONSTITUTIONAL: Well-developed, well-nourished female in no acute distress.  HENT:  Normocephalic, atraumatic EYES: Conjunctivae and EOM are normal. No scleral icterus.  NECK: Normal range of motion SKIN: Skin is warm and dry. No rash noted. Not diaphoretic.No pallor. [place]: Alert and oriented to person, place, and time. Normal coordination.   Labs and Imaging  06/27/2017 Diagnosis Endometrium, curettage - FEATURES SUGGESTIVE OF ENDOMETRIOID TYPE POLYP(S). - FRAGMENTS OF BENIGN SMOOTH MUSCLE. - THERE IS NO EVIDENCE OF HYPERPLASIA OR MALIGNANCY  Assessment & Plan:  AUB- improved after endometrial ablation but, pt is still on Megace  Decrease Megace to 40mg  q day for 1 months  CBC today  F/u in 6 weeks or sooner prn  Ananda Sitzer L. Ihor Dow, M.D., Cherlynn June    ,

## 2017-07-22 LAB — CBC
HEMOGLOBIN: 9.2 g/dL — AB (ref 11.1–15.9)
Hematocrit: 29.1 % — ABNORMAL LOW (ref 34.0–46.6)
MCH: 23.4 pg — ABNORMAL LOW (ref 26.6–33.0)
MCHC: 31.6 g/dL (ref 31.5–35.7)
MCV: 74 fL — ABNORMAL LOW (ref 79–97)
Platelets: 442 10*3/uL — ABNORMAL HIGH (ref 150–379)
RBC: 3.94 x10E6/uL (ref 3.77–5.28)
RDW: 25.4 % — ABNORMAL HIGH (ref 12.3–15.4)
WBC: 6.8 10*3/uL (ref 3.4–10.8)

## 2017-07-31 ENCOUNTER — Other Ambulatory Visit: Payer: Self-pay | Admitting: Obstetrics & Gynecology

## 2017-07-31 DIAGNOSIS — N939 Abnormal uterine and vaginal bleeding, unspecified: Secondary | ICD-10-CM

## 2017-07-31 MED ORDER — TRANEXAMIC ACID 650 MG PO TABS: 1300.0000 mg | ORAL_TABLET | Freq: Three times a day (TID) | ORAL | 1 refills | Status: DC

## 2017-08-14 ENCOUNTER — Telehealth: Payer: Self-pay | Admitting: *Deleted

## 2017-08-14 DIAGNOSIS — N939 Abnormal uterine and vaginal bleeding, unspecified: Secondary | ICD-10-CM

## 2017-08-14 MED ORDER — LEVOTHYROXINE SODIUM 112 MCG PO TABS: 166.0000 ug | ORAL_TABLET | Freq: Every day | ORAL | 1 refills | Status: DC

## 2017-08-14 NOTE — Telephone Encounter (Signed)
Pt requesting a refill on synthroid. Ok to send per [employer] [facility] [employer]. Will inbasket H-S regarding further refills.

## 2017-08-15 ENCOUNTER — Telehealth: Payer: Self-pay | Admitting: *Deleted

## 2017-08-15 NOTE — Telephone Encounter (Signed)
Received message from front desk to call patient regarding a prescription refill. Called patient. She stated she was told that a prescription for synthroid was sent to pharmacy but she has not received it. Upon review of her chart, noted rx was sent yesterday to the [facility] [address]. Patient states that is not the pharmacy she uses but she will go pick it up. Updated pharmacy preference in EPIC.

## 2017-09-03 ENCOUNTER — Encounter: Payer: Self-pay | Admitting: Obstetrics & Gynecology

## 2017-09-03 ENCOUNTER — Ambulatory Visit (INDEPENDENT_AMBULATORY_CARE_PROVIDER_SITE_OTHER): Payer: 59 | Admitting: Obstetrics & Gynecology

## 2017-09-03 ENCOUNTER — Encounter (HOSPITAL_COMMUNITY): Payer: Self-pay

## 2017-09-03 VITALS — BP 137/85 | HR 91 | Ht 64.0 in | Wt 179.9 lb

## 2017-09-03 DIAGNOSIS — D500 Iron deficiency anemia secondary to blood loss (chronic): Secondary | ICD-10-CM | POA: Diagnosis not present

## 2017-09-03 DIAGNOSIS — N939 Abnormal uterine and vaginal bleeding, unspecified: Secondary | ICD-10-CM

## 2017-09-03 MED ORDER — TRANEXAMIC ACID 650 MG PO TABS: 1300.0000 mg | ORAL_TABLET | Freq: Three times a day (TID) | ORAL | 3 refills | Status: DC

## 2017-09-03 NOTE — Progress Notes (Signed)
History:  46 y.o. G3P3 s/p SVD x3 history on file. here today for AUB. Pt reports tht she conts tio have bleeding and pain despite the Megace. Pt reports that she cont to have bleeding daily since July. Pt is still on 80mg  bid of Megace.    The following portions of the patient's history were reviewed and updated as appropriate: allergies, current medications, past family history, past medical history, past social history, past surgical history and problem list.  Review of Systems:  Pertinent items are noted in HPI.   Objective:  Physical Exam Blood pressure 137/85, pulse 91, height 5\' 4"  (1.626 m), weight 179 lb 14.4 oz (81.6 kg).  CONSTITUTIONAL: Well-developed, well-nourished female in no acute distress.  HENT:  Normocephalic, atraumatic EYES: Conjunctivae and EOM are normal. No scleral icterus.  NECK: Normal range of motion SKIN: Skin is warm and dry. No rash noted. Not diaphoretic.No pallor. [place]: Alert and oriented to person, place, and time. Normal coordination.  Abd: Soft, nontender and nondistended; no incisions noted  Pelvic: Normal appearing external genitalia; Large amount of blood in vault. Enlarged uterus to umbilicus with poor descent- actual uterus ~14cm .  no adnexal tenderness  Labs and Imaging  06/19/2017 CLINICAL DATA:  46 year old female with abnormal uterine bleeding for 3 weeks and lower abdominal pain. Anemia. LMP 05/22/2017.  EXAM: TRANSABDOMINAL AND TRANSVAGINAL ULTRASOUND OF PELVIS  TECHNIQUE: Both transabdominal and transvaginal ultrasound examinations of the pelvis were performed. Transabdominal technique was performed for global imaging of the pelvis including uterus, ovaries, adnexal regions, and pelvic cul-de-sac. It was necessary to proceed with endovaginal exam following the transabdominal exam to visualize the endometrium, myometrium and adnexa.  COMPARISON:  None  FINDINGS: Uterus  Measurements: 12.5 x 7.0 x 7.7 cm. Enlarged  anteverted myomatous uterus, with representative fibroids as follows:  - left posterior fundal 4.8 x 4.1 x 4.6 cm subserosal fibroid  - right anterior uterine body intramural 3.6 x 2.5 x 3.6 cm fibroid  - posterior right uterine body intramural 1.5 x 1.2 x 1.6 cm fibroid  - right anterior uterine body intramural 2.1 x 2.1 x 1.7 cm fibroid  Endometrium  There is a large 4.6 x 3.6 x 3.9 cm mass distending the endometrial cavity, which demonstrates heterogeneous echogenicity and internal vascularity on color Doppler.  Right ovary  Measurements: 3.3 x 1.9 x 2.0 cm. Normal appearance/no adnexal mass.  Left ovary  Measurements: 3.1 x 1.9 x 2.3 cm. Normal appearance/no adnexal mass.  Other findings  No abnormal free fluid.  IMPRESSION: 1. Enlarged anteverted myomatous uterus as detailed . 2. Large 4.6 x 3.6 x 3.9 cm heterogeneous solid mass distending the endometrial cavity, most likely to represent an intracavitary fibroid. Endometrial malignancy is less likely but not excluded. Consider hysteroscopic evaluation with directed tissue sampling. Saline infusion hysterosonography could be obtained for further characterization, as clinically warranted. 3. Normal ovaries.  No adnexal masses. 06/27/2017 Diagnosis Endometrium, curettage - FEATURES SUGGESTIVE OF ENDOMETRIOID TYPE POLYP(S). - FRAGMENTS OF BENIGN SMOOTH MUSCLE. - THERE IS NO EVIDENCE OF HYPERPLASIA OR MALIGNANCY  Assessment & Plan:  AUB - pt is s/p endometrial ablation. Pt cont to have heavy bleeding after the procedure even with Megace 80 mg bid.  Pt requests definitive tx with hyst.  Patient desires surgical management with [place] with bilateral salpingectomy.  The risks of surgery were discussed in detail with the patient including but not limited to: bleeding which may require transfusion or reoperation; infection which may require prolonged hospitalization or re-hospitalization  and antibiotic therapy;  injury to bowel, bladder, ureters and major vessels or other surrounding organs; need for additional procedures including laparotomy; thromboembolic phenomenon, incisional problems and other postoperative or anesthesia complications.  Patient was told that the likelihood that her condition and symptoms will be treated effectively with this surgical management was very high; the postoperative expectations were also discussed in detail. The patient also understands the alternative treatment options which were discussed in full. All questions were answered.  She was told that she will be contacted by our surgical scheduler regarding the time and date of her surgery; routine preoperative instructions of having nothing to eat or drink after midnight on the day prior to surgery and also coming to the hospital 1 1/2 hours prior to her time of surgery were also emphasized.  She was told she may be called for a preoperative appointment about a week prior to surgery and will be given further preoperative instructions at that visit. Printed patient education handouts about the procedure were given to the patient to review at home.   Lysteda 1300mg  tid Hold Megace CBC and TSH today  Total face-to-face time with patient was 30 min.  Greater than 50% was spent in counseling and coordination of care with the patient.   Jesiah Grismer L. Harraway-Smith, M.D., Cherlynn June

## 2017-09-04 LAB — CBC WITH DIFFERENTIAL/PLATELET
BASOS: 0 %
Basophils Absolute: 0 10*3/uL (ref 0.0–0.2)
EOS (ABSOLUTE): 0.1 10*3/uL (ref 0.0–0.4)
EOS: 2 %
Hematocrit: 27.9 % — ABNORMAL LOW (ref 34.0–46.6)
Hemoglobin: 8 g/dL — ABNORMAL LOW (ref 11.1–15.9)
IMMATURE GRANS (ABS): 0 10*3/uL (ref 0.0–0.1)
IMMATURE GRANULOCYTES: 0 %
LYMPHS: 39 %
Lymphocytes Absolute: 2.7 10*3/uL (ref 0.7–3.1)
MCH: 22.7 pg — AB (ref 26.6–33.0)
MCHC: 28.7 g/dL — ABNORMAL LOW (ref 31.5–35.7)
MCV: 79 fL (ref 79–97)
MONOCYTES: 6 %
Monocytes Absolute: 0.4 10*3/uL (ref 0.1–0.9)
NEUTROS ABS: 3.6 10*3/uL (ref 1.4–7.0)
NEUTROS PCT: 53 %
PLATELETS: 427 10*3/uL — AB (ref 150–379)
RBC: 3.53 x10E6/uL — ABNORMAL LOW (ref 3.77–5.28)
RDW: 18.9 % — ABNORMAL HIGH (ref 12.3–15.4)
WBC: 6.8 10*3/uL (ref 3.4–10.8)

## 2017-09-04 LAB — TSH: TSH: 0.738 u[IU]/mL (ref 0.450–4.500)

## 2017-09-05 ENCOUNTER — Other Ambulatory Visit: Payer: Self-pay | Admitting: Obstetrics & Gynecology

## 2017-09-05 ENCOUNTER — Telehealth: Payer: Self-pay

## 2017-09-05 MED ORDER — INTEGRA F 125-1 MG PO CAPS: 1.0000 | ORAL_CAPSULE | Freq: Every day | ORAL | 3 refills | Status: DC

## 2017-09-05 NOTE — Telephone Encounter (Signed)
Opened in error

## 2017-09-05 NOTE — Progress Notes (Signed)
Notified pt that the provider has requested her to take a certain type of iron supplement, Integra to help aid in the prevention of the need of possible blood transfusion.   I informed her that the provider would like for her to take the medication starting today until her surgery date that is attentive for 11/04/17 @ 0630.  Pt stated understanding with no further questions.

## 2017-09-08 ENCOUNTER — Telehealth: Payer: Self-pay | Admitting: *Deleted

## 2017-09-08 NOTE — Telephone Encounter (Signed)
Holly Hamilton called today and left a message she saw Dr.Harraway-Smith on the  12 th and talked about surgery.  States she is a little concerned because she is passing very large clots for the past day and feels a little weak and a little worried.  Also states there was a special iron supplement the doctor wanted her to take, but it wasn't at the pharmacy.  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 Would like a blood test because she is worried about bled out.

## 2017-09-09 ENCOUNTER — Telehealth: Payer: Self-pay

## 2017-09-09 NOTE — Telephone Encounter (Signed)
Opened in error

## 2017-09-09 NOTE — Telephone Encounter (Signed)
Patient called the office stating that her Iron prescribed by Dr. Ihor Dow on 09/05/17 was not at her pharmacy. Patient was also concerned that she has lost too much blood and wants to come in to have her iron checked Left messaged on patient's vm stating that her medication was sent to [facility] on [address] on 9/14. Also informed pt that she can make an appointment to come in to have her labs drawn. Instructed pt to call the office if she has any questions.

## 2017-09-10 ENCOUNTER — Telehealth: Payer: Self-pay

## 2017-09-10 NOTE — Telephone Encounter (Signed)
Pt called and stated that the iron table that she was prescribed was not suppose to be sent to [facility] on [address].  Pt left correct pharmacy in message but message was clear to understand.  LM for pt to please return call back and leave in message again the pharmacy that she would like the iron tablet to be sent and that I apologize for the inconvenience.

## 2017-09-17 ENCOUNTER — Observation Stay (HOSPITAL_COMMUNITY)
Admission: AD | Admit: 2017-09-17 | Discharge: 2017-09-18 | Disposition: A | Discharge status: Home / Self Care | Payer: 59 | Source: Ambulatory Visit | Attending: Family Medicine | Admitting: Family Medicine

## 2017-09-17 ENCOUNTER — Encounter (HOSPITAL_COMMUNITY): Payer: Self-pay | Admitting: *Deleted

## 2017-09-17 DIAGNOSIS — N852 Hypertrophy of uterus: Secondary | ICD-10-CM | POA: Insufficient documentation

## 2017-09-17 DIAGNOSIS — D259 Leiomyoma of uterus, unspecified: Secondary | ICD-10-CM | POA: Diagnosis not present

## 2017-09-17 DIAGNOSIS — R102 Pelvic and perineal pain: Secondary | ICD-10-CM | POA: Diagnosis not present

## 2017-09-17 DIAGNOSIS — Z9889 Other specified postprocedural states: Secondary | ICD-10-CM | POA: Insufficient documentation

## 2017-09-17 DIAGNOSIS — R531 Weakness: Secondary | ICD-10-CM | POA: Diagnosis not present

## 2017-09-17 DIAGNOSIS — N938 Other specified abnormal uterine and vaginal bleeding: Secondary | ICD-10-CM | POA: Diagnosis present

## 2017-09-17 DIAGNOSIS — I10 Essential (primary) hypertension: Secondary | ICD-10-CM | POA: Insufficient documentation

## 2017-09-17 DIAGNOSIS — N921 Excessive and frequent menstruation with irregular cycle: Secondary | ICD-10-CM | POA: Insufficient documentation

## 2017-09-17 DIAGNOSIS — R42 Dizziness and giddiness: Secondary | ICD-10-CM | POA: Diagnosis not present

## 2017-09-17 DIAGNOSIS — D509 Iron deficiency anemia, unspecified: Secondary | ICD-10-CM | POA: Diagnosis not present

## 2017-09-17 DIAGNOSIS — Z79899 Other long term (current) drug therapy: Secondary | ICD-10-CM | POA: Insufficient documentation

## 2017-09-17 LAB — COMPREHENSIVE METABOLIC PANEL
ALBUMIN: 3.3 g/dL — AB (ref 3.5–5.0)
ALK PHOS: 46 U/L (ref 38–126)
ALT: 22 U/L (ref 14–54)
ANION GAP: 9 (ref 5–15)
AST: 23 U/L (ref 15–41)
BILIRUBIN TOTAL: 0.1 mg/dL — AB (ref 0.3–1.2)
BUN: 16 mg/dL (ref 6–20)
CALCIUM: 8.4 mg/dL — AB (ref 8.9–10.3)
CO2: 24 mmol/L (ref 22–32)
Chloride: 106 mmol/L (ref 101–111)
Creatinine, Ser: 0.77 mg/dL (ref 0.44–1.00)
GFR calc Af Amer: >60 mL/min (ref >60)
GLUCOSE: 98 mg/dL (ref 65–99)
POTASSIUM: 3 mmol/L — AB (ref 3.5–5.1)
Sodium: 139 mmol/L (ref 135–145)
TOTAL PROTEIN: 7.2 g/dL (ref 6.5–8.1)

## 2017-09-17 LAB — URINALYSIS, ROUTINE W REFLEX MICROSCOPIC
BACTERIA UA: NONE SEEN
BILIRUBIN URINE: NEGATIVE
GLUCOSE, UA: NEGATIVE mg/dL
Ketones, ur: NEGATIVE mg/dL
NITRITE: NEGATIVE
Protein, ur: 30 mg/dL — AB
SPECIFIC GRAVITY, URINE: 1.02 (ref 1.005–1.030)
pH: 6 (ref 5.0–8.0)

## 2017-09-17 LAB — CBC
HEMATOCRIT: 19.5 % — AB (ref 36.0–46.0)
Hemoglobin: 5.8 g/dL — CL (ref 12.0–15.0)
MCH: 22.7 pg — AB (ref 26.0–34.0)
MCHC: 29.7 g/dL — AB (ref 30.0–36.0)
MCV: 76.2 fL — AB (ref 78.0–100.0)
PLATELETS: 373 10*3/uL (ref 150–400)
RBC: 2.56 MIL/uL — ABNORMAL LOW (ref 3.87–5.11)
RDW: 17.6 % — AB (ref 11.5–15.5)
WBC: 9.2 10*3/uL (ref 4.0–10.5)

## 2017-09-17 LAB — PREPARE RBC (CROSSMATCH)

## 2017-09-17 MED ORDER — LEVOTHYROXINE SODIUM 112 MCG PO TABS
168.0000 ug | ORAL_TABLET | Freq: Every day | ORAL | Status: DC
  Administered 2017-09-18: 168 ug via ORAL
  Filled 2017-09-17: qty 1.5

## 2017-09-17 MED ORDER — ACETAMINOPHEN 500 MG PO TABS
1000.0000 mg | ORAL_TABLET | Freq: Once | ORAL | Status: AC
  Administered 2017-09-17: 1000 mg via ORAL
  Filled 2017-09-17: qty 2

## 2017-09-17 MED ORDER — FERROUS SULFATE 325 (65 FE) MG PO TABS
325.0000 mg | ORAL_TABLET | Freq: Two times a day (BID) | ORAL | Status: DC
  Administered 2017-09-17: 325 mg via ORAL
  Filled 2017-09-17: qty 1

## 2017-09-17 MED ORDER — PRENATAL MULTIVITAMIN CH: 1.0000 | ORAL_TABLET | Freq: Every day | ORAL | Status: DC

## 2017-09-17 MED ORDER — TRANEXAMIC ACID 650 MG PO TABS
1300.0000 mg | ORAL_TABLET | Freq: Three times a day (TID) | ORAL | Status: DC
  Administered 2017-09-17: 1300 mg via ORAL
  Filled 2017-09-17 (×4): qty 2

## 2017-09-17 MED ORDER — SODIUM CHLORIDE 0.9 % IV SOLN
Freq: Once | INTRAVENOUS | Status: AC
  Administered 2017-09-17: 13:00:00 via INTRAVENOUS

## 2017-09-17 MED ORDER — SODIUM CHLORIDE 0.9 % IV SOLN: Freq: Once | INTRAVENOUS | Status: DC

## 2017-09-17 MED ORDER — MEGESTROL ACETATE 40 MG PO TABS
120.0000 mg | ORAL_TABLET | Freq: Every day | ORAL | Status: DC
  Administered 2017-09-17: 120 mg via ORAL
  Filled 2017-09-17 (×2): qty 3

## 2017-09-17 MED ORDER — ESTROGENS CONJUGATED 25 MG IJ SOLR
25.0000 mg | Freq: Four times a day (QID) | INTRAMUSCULAR | Status: DC | PRN
  Administered 2017-09-17 – 2017-09-18 (×3): 25 mg via INTRAVENOUS
  Filled 2017-09-17 (×4): qty 25

## 2017-09-17 MED ORDER — ACETAMINOPHEN 325 MG PO TABS
650.0000 mg | ORAL_TABLET | Freq: Once | ORAL | Status: AC
  Administered 2017-09-17: 650 mg via ORAL
  Filled 2017-09-17: qty 2

## 2017-09-17 MED ORDER — SODIUM CHLORIDE 0.9 % IV SOLN
510.0000 mg | Freq: Once | INTRAVENOUS | Status: AC
  Administered 2017-09-18: 510 mg via INTRAVENOUS
  Filled 2017-09-17: qty 17

## 2017-09-17 MED ORDER — HYDROCHLOROTHIAZIDE 25 MG PO TABS: 25.0000 mg | ORAL_TABLET | Freq: Every day | ORAL | Status: DC

## 2017-09-17 MED ORDER — KETOROLAC TROMETHAMINE 30 MG/ML IJ SOLN
30.0000 mg | Freq: Once | INTRAMUSCULAR | Status: AC
  Administered 2017-09-17: 30 mg via INTRAVENOUS
  Filled 2017-09-17: qty 1

## 2017-09-17 NOTE — Progress Notes (Signed)
Dr. Ernestina Patches called, confirming medication orders.  2 new units of RBC requested for transfusion per Dr. Ernestina Patches.  Directed to not give 1230 medications.

## 2017-09-17 NOTE — Progress Notes (Signed)
Dr Elonda Husky called to verify Premarin order.  Directed to administer medication following 2nd unit of RBC, prior to infusion of 3rd PRBC.

## 2017-09-17 NOTE — MAU Note (Signed)
Pt presents to MAU with complaints of heavy vaginal bleeding since the middle of July. States that she is scheduled for a hysterectomy on November the 13th with Dr Evorn Gong

## 2017-09-17 NOTE — Progress Notes (Signed)
2nd Unit RBC started at 1509.  Patient responding well, asymptomatic and educated regarding transfusion reactions.  Continuing to monitor.

## 2017-09-17 NOTE — H&P (Signed)
Holly Hamilton is an 46 y.o. female with history of dysfunctional uterine bleeding s/p multiple therapies including 1) D/C with hysteroscopy and ablation in July 17 2017  2) Megace- max dose reached and d/dc on 9/12 3) Lysteda- started on 9/12 4) increased levothyroxine dose (last in June) and TSh recheck wnl in Sept 2018  She has received a blood transfusion in June 2018 (3 units) for Hgb of 4.4 Plan at last outpatient visit was for hysterectomy in Nov 2018    Past Medical History:  Diagnosis Date  . Anemia   . Grave's disease   . Headache   . Hypertension     Past Surgical History:  Procedure Laterality Date  . DILITATION & CURRETTAGE/HYSTROSCOPY WITH NOVASURE ABLATION  06/27/2017   Procedure: DILATATION & CURETTAGE/HYSTEROSCOPY WITH NOVASURE ABLATION WITH PAP SMEAR;  Surgeon: Lavonia Drafts, MD;  Location: [facility];  Service: Gynecology;;  . THYROIDECTOMY      No family history on file.  Social History:  reports that she has never smoked. She has never used smokeless tobacco. She reports that she does not drink alcohol or use drugs.  Allergies:  No Active Allergies  Prescriptions Prior to Admission  Medication Sig Dispense Refill Last Dose  . Cranberry 500 MG CAPS Take 500 mg by mouth.   09/16/2017 at Unknown time  . Fe Fum-FePoly-FA-Vit C-Vit B3 (INTEGRA F) 125-1 MG CAPS Take 1 capsule by mouth daily. 60 capsule 3 09/16/2017 at Unknown time  . ferrous sulfate (FERROUSUL) 325 (65 FE) MG tablet Take 1 tablet (325 mg total) by mouth 2 (two) times daily. 60 tablet 1 09/16/2017 at Unknown time  . hydrochlorothiazide (HYDRODIURIL) 25 MG tablet Take 25 mg by mouth daily.   09/16/2017 at Unknown time  . levothyroxine (SYNTHROID, LEVOTHROID) 112 MCG tablet Take 1.5 tablets (168 mcg total) by mouth daily before breakfast. 45 tablet 1 09/16/2017 at Unknown time  . megestrol (MEGACE) 40 MG tablet Take 1 tablet (40 mg total) by mouth 2 (two) times daily. 30 tablet 2 09/16/2017 at Unknown  time  . Multiple Vitamin (MULTIVITAMIN) tablet Take 1 tablet by mouth daily.   09/16/2017 at Unknown time  . tranexamic acid (LYSTEDA) 650 MG TABS tablet Take 2 tablets (1,300 mg total) by mouth 3 (three) times daily. 30 tablet 3 09/16/2017 at Unknown time  . cyclobenzaprine (FLEXERIL) 5 MG tablet Take 5 mg by mouth 3 (three) times daily as needed for muscle spasms.    prn  . ibuprofen (ADVIL,MOTRIN) 800 MG tablet Take 1 tablet (800 mg total) by mouth every 8 (eight) hours as needed. (Patient not taking: Reported on 09/03/2017) 30 tablet 0 Not Taking  . oxyCODONE-acetaminophen (PERCOCET/ROXICET) 5-325 MG tablet Take 1-2 tablets by mouth every 6 (six) hours as needed for moderate pain or severe pain. (Patient not taking: Reported on 07/21/2017) 15 tablet 0 Not Taking  . oxyCODONE-acetaminophen (PERCOCET/ROXICET) 5-325 MG tablet Take 1-2 tablets by mouth every 6 (six) hours as needed. (Patient not taking: Reported on 07/21/2017) 15 tablet 0 Not Taking    Review of Systems  Constitutional: Positive for malaise/fatigue.  HENT: Negative.   Eyes: Negative.   Respiratory: Positive for shortness of breath. Negative for cough.   Cardiovascular: Negative.   Gastrointestinal: Negative.   Genitourinary: Negative.   Musculoskeletal: Negative.   Skin: Negative.   Neurological: Positive for dizziness and weakness. Negative for tingling and focal weakness.  Endo/Heme/Allergies: Negative.   Psychiatric/Behavioral: Negative.     Blood pressure (!) 110/55, pulse  90, temperature 98.4 F (36.9 C), temperature source Oral, resp. rate 16, height 5\' 4"  (1.626 m), weight 177 lb (80.3 kg), SpO2 100 %. Physical Exam  Nursing note and vitals reviewed. Constitutional: She is oriented to person, place, and time. She appears well-developed and well-nourished. No distress.  HENT:  Head: Normocephalic and atraumatic.  Eyes: Conjunctivae are normal. No scleral icterus.  Neck: Normal range of motion. Neck supple.   Cardiovascular: Normal rate and intact distal pulses.   Respiratory: Effort normal. She exhibits no tenderness.  GI: Soft. There is no tenderness. There is no rebound and no guarding.  Genitourinary: Vagina normal.  Musculoskeletal: Normal range of motion. She exhibits no edema.  Neurological: She is alert and oriented to person, place, and time.  Skin: Skin is warm and dry. No rash noted.  Psychiatric: She has a normal mood and affect.    Results for orders placed or performed during the hospital encounter of 09/17/17 (from the past 24 hour(s))  Urinalysis, Routine w reflex microscopic     Status: Abnormal   Collection Time: 09/17/17  8:00 AM  Result Value Ref Range   Color, Urine YELLOW YELLOW   APPearance HAZY (A) CLEAR   Specific Gravity, Urine 1.020 1.005 - 1.030   pH 6.0 5.0 - 8.0   Glucose, UA NEGATIVE NEGATIVE mg/dL   Hgb urine dipstick LARGE (A) NEGATIVE   Bilirubin Urine NEGATIVE NEGATIVE   Ketones, ur NEGATIVE NEGATIVE mg/dL   Protein, ur 30 (A) NEGATIVE mg/dL   Nitrite NEGATIVE NEGATIVE   Leukocytes, UA MODERATE (A) NEGATIVE   RBC / HPF TOO NUMEROUS TO COUNT 0 - 5 RBC/hpf   WBC, UA TOO NUMEROUS TO COUNT 0 - 5 WBC/hpf   Bacteria, UA NONE SEEN NONE SEEN   Squamous Epithelial / LPF 0-5 (A) NONE SEEN   Mucus PRESENT   CBC     Status: Abnormal   Collection Time: 09/17/17  9:50 AM  Result Value Ref Range   WBC 9.2 4.0 - 10.5 K/uL   RBC 2.56 (L) 3.87 - 5.11 MIL/uL   Hemoglobin 5.8 (LL) 12.0 - 15.0 g/dL   HCT 19.5 (L) 36.0 - 46.0 %   MCV 76.2 (L) 78.0 - 100.0 fL   MCH 22.7 (L) 26.0 - 34.0 pg   MCHC 29.7 (L) 30.0 - 36.0 g/dL   RDW 17.6 (H) 11.5 - 15.5 %   Platelets 373 150 - 400 K/uL  Comprehensive metabolic panel     Status: Abnormal   Collection Time: 09/17/17  9:50 AM  Result Value Ref Range   Sodium 139 135 - 145 mmol/L   Potassium 3.0 (L) 3.5 - 5.1 mmol/L   Chloride 106 101 - 111 mmol/L   CO2 24 22 - 32 mmol/L   Glucose, Bld 98 65 - 99 mg/dL   BUN 16 6 - 20  mg/dL   Creatinine, Ser 0.77 0.44 - 1.00 mg/dL   Calcium 8.4 (L) 8.9 - 10.3 mg/dL   Total Protein 7.2 6.5 - 8.1 g/dL   Albumin 3.3 (L) 3.5 - 5.0 g/dL   AST 23 15 - 41 U/L   ALT 22 14 - 54 U/L   Alkaline Phosphatase 46 38 - 126 U/L   Total Bilirubin 0.1 (L) 0.3 - 1.2 mg/dL   GFR calc non Af Amer >60 >60 mL/min   GFR calc Af Amer >60 >60 mL/min   Anion gap 9 5 - 15  Type and screen [place]  Status: None (Preliminary result)   Collection Time: 09/17/17  9:51 AM  Result Value Ref Range   ABO/RH(D) O POS    Antibody Screen NEG    Sample Expiration 09/20/2017    Unit Number [id number]    Blood Component Type RED CELLS,LR    Unit division 00    Status of Unit ISSUED    Transfusion Status OK TO TRANSFUSE    Crossmatch Result Compatible    Unit Number [id number]    Blood Component Type RED CELLS,LR    Unit division 00    Status of Unit ALLOCATED    Transfusion Status OK TO TRANSFUSE    Crossmatch Result Compatible    Unit Number [id number]    Blood Component Type RBC, LR IRR    Unit division 00    Status of Unit ISSUED    Transfusion Status OK TO TRANSFUSE    Crossmatch Result Compatible    Unit Number [id number]    Blood Component Type RED CELLS,LR    Unit division 00    Status of Unit ALLOCATED    Transfusion Status OK TO TRANSFUSE    Crossmatch Result Compatible   Prepare RBC     Status: None   Collection Time: 09/17/17 12:00 PM  Result Value Ref Range   Order Confirmation ORDER PROCESSED BY BLOOD BANK   Prepare RBC     Status: None   Collection Time: 09/17/17  4:00 PM  Result Value Ref Range   Order Confirmation ORDER PROCESSED BY BLOOD BANK     No results found.  Assessment/Plan: Admit to Med-Surg Transfuse 4 un pRBCs Provera IV to help stabilize endometrium Continue Megace and Lysteda Plan to discuss moving up case with scheduler and GYNs in practice to determine if she can have hysterectomy prior to 11/13 when she is  currently scheduled.  Discussed plan with patient and husband at bedside and they agree with plan  Caren Macadam 09/17/2017, 4:32 PM

## 2017-09-17 NOTE — MAU Provider Note (Signed)
History     CSN: [id number]  Arrival date and time: 09/17/17 4970   First Provider Initiated Contact with Patient 09/17/17 0932      Chief Complaint  Patient presents with  . Vaginal Bleeding   Holly Hamilton is a 46 y.o. Who presents today with vaginal bleeding. She states that she has been bleeding since June. She had an ablation done in July, and that did not help the bleeding. She has been on Megace she reports that did not help. She is now on Lysteda, and states that has not had any impact on her bleeding. She is scheduled for hysterectomy on 11/04/17. She is here today with continued bleeding. In addition she is feeling dizzy and weak.    Vaginal Bleeding  The patient's primary symptoms include pelvic pain and vaginal bleeding. This is a new problem. The current episode started more than 1 month ago. The problem occurs constantly. The problem has been unchanged. The pain is mild. The problem affects the right side. She is not pregnant. Pertinent negatives include no chills, fever, nausea or vomiting. The vaginal discharge was bloody. The vaginal bleeding is heavier than menses. She has been passing clots (about the size of a grapefruit. ). She has not been passing tissue. Nothing aggravates the symptoms. Treatments tried: lysteda, megace. The treatment provided no relief.     Past Medical History:  Diagnosis Date  . Anemia   . Grave's disease   . Headache   . Hypertension     Past Surgical History:  Procedure Laterality Date  . DILITATION & CURRETTAGE/HYSTROSCOPY WITH NOVASURE ABLATION  06/27/2017   Procedure: DILATATION & CURETTAGE/HYSTEROSCOPY WITH NOVASURE ABLATION WITH PAP SMEAR;  Surgeon: Lavonia Drafts, MD;  Location: [facility];  Service: Gynecology;;  . THYROIDECTOMY      No family history on file.  Social History  Substance Use Topics  . Smoking status: Never Smoker  . Smokeless tobacco: Never Used  . Alcohol use No    Allergies:  Allergies  Allergen  Reactions  . Estrogens     MCA blockage, should never take estrogens- pt unsure of this- may have been added by provider    Prescriptions Prior to Admission  Medication Sig Dispense Refill Last Dose  . Cranberry 500 MG CAPS Take 500 mg by mouth.   09/16/2017 at Unknown time  . Fe Fum-FePoly-FA-Vit C-Vit B3 (INTEGRA F) 125-1 MG CAPS Take 1 capsule by mouth daily. 60 capsule 3 09/16/2017 at Unknown time  . ferrous sulfate (FERROUSUL) 325 (65 FE) MG tablet Take 1 tablet (325 mg total) by mouth 2 (two) times daily. 60 tablet 1 09/16/2017 at Unknown time  . hydrochlorothiazide (HYDRODIURIL) 25 MG tablet Take 25 mg by mouth daily.   09/16/2017 at Unknown time  . levothyroxine (SYNTHROID, LEVOTHROID) 112 MCG tablet Take 1.5 tablets (168 mcg total) by mouth daily before breakfast. 45 tablet 1 09/16/2017 at Unknown time  . megestrol (MEGACE) 40 MG tablet Take 1 tablet (40 mg total) by mouth 2 (two) times daily. 30 tablet 2 09/16/2017 at Unknown time  . Multiple Vitamin (MULTIVITAMIN) tablet Take 1 tablet by mouth daily.   09/16/2017 at Unknown time  . tranexamic acid (LYSTEDA) 650 MG TABS tablet Take 2 tablets (1,300 mg total) by mouth 3 (three) times daily. 30 tablet 3 09/16/2017 at Unknown time  . cyclobenzaprine (FLEXERIL) 5 MG tablet Take 5 mg by mouth 3 (three) times daily as needed for muscle spasms.    prn  . ibuprofen (  ADVIL,MOTRIN) 800 MG tablet Take 1 tablet (800 mg total) by mouth every 8 (eight) hours as needed. (Patient not taking: Reported on 09/03/2017) 30 tablet 0 Not Taking  . oxyCODONE-acetaminophen (PERCOCET/ROXICET) 5-325 MG tablet Take 1-2 tablets by mouth every 6 (six) hours as needed for moderate pain or severe pain. (Patient not taking: Reported on 07/21/2017) 15 tablet 0 Not Taking  . oxyCODONE-acetaminophen (PERCOCET/ROXICET) 5-325 MG tablet Take 1-2 tablets by mouth every 6 (six) hours as needed. (Patient not taking: Reported on 07/21/2017) 15 tablet 0 Not Taking    Review of Systems   Constitutional: Negative for chills and fever.  Gastrointestinal: Negative for nausea and vomiting.  Genitourinary: Positive for pelvic pain and vaginal bleeding.  Neurological: Positive for dizziness and weakness.   Physical Exam   Blood pressure 117/69, pulse (!) 101, temperature 98.3 F (36.8 C), resp. rate 18, SpO2 100 %.  Physical Exam  Nursing note and vitals reviewed. Constitutional: She is oriented to person, place, and time. She appears well-developed and well-nourished. No distress.  HENT:  Head: Normocephalic.  Cardiovascular: Normal rate.   Respiratory: Effort normal.  GI: Soft. There is no tenderness. There is no rebound.  Genitourinary:  Genitourinary Comments:  External: no lesion Vagina: small amount of BRB noted  Cervix: pink, smooth, no CMT Uterus: 14 week size uterus  Adnexa: NT   Neurological: She is alert and oriented to person, place, and time.  Skin: Skin is warm and dry.  Psychiatric: She has a normal mood and affect.     Results for orders placed or performed during the hospital encounter of 09/17/17 (from the past 24 hour(s))  Urinalysis, Routine w reflex microscopic     Status: Abnormal   Collection Time: 09/17/17  8:00 AM  Result Value Ref Range   Color, Urine YELLOW YELLOW   APPearance HAZY (A) CLEAR   Specific Gravity, Urine 1.020 1.005 - 1.030   pH 6.0 5.0 - 8.0   Glucose, UA NEGATIVE NEGATIVE mg/dL   Hgb urine dipstick LARGE (A) NEGATIVE   Bilirubin Urine NEGATIVE NEGATIVE   Ketones, ur NEGATIVE NEGATIVE mg/dL   Protein, ur 30 (A) NEGATIVE mg/dL   Nitrite NEGATIVE NEGATIVE   Leukocytes, UA MODERATE (A) NEGATIVE   RBC / HPF TOO NUMEROUS TO COUNT 0 - 5 RBC/hpf   WBC, UA TOO NUMEROUS TO COUNT 0 - 5 WBC/hpf   Bacteria, UA NONE SEEN NONE SEEN   Squamous Epithelial / LPF 0-5 (A) NONE SEEN   Mucus PRESENT   CBC     Status: Abnormal   Collection Time: 09/17/17  9:50 AM  Result Value Ref Range   WBC 9.2 4.0 - 10.5 K/uL   RBC 2.56 (L)  3.87 - 5.11 MIL/uL   Hemoglobin 5.8 (LL) 12.0 - 15.0 g/dL   HCT 19.5 (L) 36.0 - 46.0 %   MCV 76.2 (L) 78.0 - 100.0 fL   MCH 22.7 (L) 26.0 - 34.0 pg   MCHC 29.7 (L) 30.0 - 36.0 g/dL   RDW 17.6 (H) 11.5 - 15.5 %   Platelets 373 150 - 400 K/uL  Comprehensive metabolic panel     Status: Abnormal   Collection Time: 09/17/17  9:50 AM  Result Value Ref Range   Sodium 139 135 - 145 mmol/L   Potassium 3.0 (L) 3.5 - 5.1 mmol/L   Chloride 106 101 - 111 mmol/L   CO2 24 22 - 32 mmol/L   Glucose, Bld 98 65 - 99 mg/dL   BUN  16 6 - 20 mg/dL   Creatinine, Ser 0.77 0.44 - 1.00 mg/dL   Calcium 8.4 (L) 8.9 - 10.3 mg/dL   Total Protein 7.2 6.5 - 8.1 g/dL   Albumin 3.3 (L) 3.5 - 5.0 g/dL   AST 23 15 - 41 U/L   ALT 22 14 - 54 U/L   Alkaline Phosphatase 46 38 - 126 U/L   Total Bilirubin 0.1 (L) 0.3 - 1.2 mg/dL   GFR calc non Af Amer >60 >60 mL/min   GFR calc Af Amer >60 >60 mL/min   Anion gap 9 5 - 15    MAU Course  Procedures  MDM Dr. Ernestina Patches notified of patient and hgb. Will admit for transfusion   Assessment and Plan  Abnormal uterine bleeding Symptomatic anemia Admit for PRBCs   Marcille Buffy 09/17/2017, 9:35 AM

## 2017-09-17 NOTE — Progress Notes (Signed)
First unit of RBC started at 1255.  Informed consent signed, 650mg  Tylenol given.  Patient educated regarding signs and symptoms of infusion reaction.  No previous history of infusion reaction.

## 2017-09-17 NOTE — MAU Note (Signed)
CRITICAL VALUE ALERT  Critical Value:  HGB 5.8  Date & Time Notied:  09/17/2017 1030  Provider Notified: Marcille Buffy, CNM  Orders Received/Actions taken: 09/17/2017--CNM states will notify attending.

## 2017-09-17 NOTE — Progress Notes (Signed)
Dr Elonda Husky in department.  Iron infusion to be administered following completion of RBCs.  CBC to be drawn following completion of blood administration as well.

## 2017-09-18 LAB — CBC
HEMATOCRIT: 29.2 % — AB (ref 36.0–46.0)
Hemoglobin: 9.5 g/dL — ABNORMAL LOW (ref 12.0–15.0)
MCH: 26 pg (ref 26.0–34.0)
MCHC: 32.5 g/dL (ref 30.0–36.0)
MCV: 79.8 fL (ref 78.0–100.0)
PLATELETS: 300 10*3/uL (ref 150–400)
RBC: 3.66 MIL/uL — AB (ref 3.87–5.11)
RDW: 16.1 % — ABNORMAL HIGH (ref 11.5–15.5)
WBC: 11.6 10*3/uL — AB (ref 4.0–10.5)

## 2017-09-18 MED ORDER — OXYCODONE-ACETAMINOPHEN 5-325 MG PO TABS: 1.0000 | ORAL_TABLET | Freq: Four times a day (QID) | ORAL | 0 refills | Status: DC | PRN

## 2017-09-18 MED ORDER — MEGESTROL ACETATE 40 MG PO TABS: 120.0000 mg | ORAL_TABLET | Freq: Every day | ORAL | 0 refills | Status: DC

## 2017-09-18 NOTE — Progress Notes (Signed)
Upon ambulation to restroom, patient passed numerous large clots.  Total blood loss: 355g.  Dr. Elonda Husky notified. Patient asymptomatic at this time.  BP 128/75, pulse 92, SPO2 100%, Temp 98.8.  RR 20.   Instructed to proceed with discharge orders following 0918 dose of Pyrmarin.

## 2017-09-18 NOTE — Patient Instructions (Signed)
Holly Hamilton  09/18/2017     @PREFPERIOPPHARMACY @   Your procedure is scheduled on  09/24/2017 .  Report to [facility] at  930  A.M.  Call this number if you have problems the morning of surgery:  [phone]   Remember:  Do not eat food or drink liquids after midnight.  Take these medicines the morning of surgery with A SIP OF WATER  Levothyroxine, oxycodone.   Do not wear jewelry, make-up or nail polish.  Do not wear lotions, powders, or perfumes, or deoderant.  Do not shave 48 hours prior to surgery.  Men may shave face and neck.  Do not bring valuables to the hospital.  [facility] is not responsible for any belongings or valuables.  Contacts, dentures or bridgework may not be worn into surgery.  Leave your suitcase in the car.  After surgery it may be brought to your room.  For patients admitted to the hospital, discharge time will be determined by your treatment team.  Patients discharged the day of surgery will not be allowed to drive home.   Name and phone number of your driver:   family Special instructions:  None  Please read over the following fact sheets that you were given. Anesthesia Post-op Instructions and Care and Recovery After Surgery       Salpingectomy Salpingectomy, also called tubectomy, is the surgical removal of one of the fallopian tubes. The fallopian tubes are where eggs travel from the ovaries to the uterus. Removing one fallopian tube does not prevent you from becoming pregnant. It also does not cause problems with your menstrual periods. You may need a salpingectomy if you:  Have a fertilized egg that attaches to the fallopian tube (ectopic pregnancy), especially one that causes the tube to burst or tear (rupture).  Have an infected fallopian tube.  Have cancer of the fallopian tube or nearby organs.  Have had an ovary removed due to a cyst or tumor.  Have had your uterus removed.  There are three different methods  that can be used for a salpingectomy:  Open. This method involves making one large incision in your abdomen.  Laparoscopic. This method involves using a thin, lighted tube with a tiny camera on the end (laparoscope) to help perform the procedure. The laparoscope will allow your surgeon to make several small incisions in the abdomen instead of a large incision.  Robot-assisted: This method involves using a computer to control surgical instruments that are attached to robotic arms.  Tell a health care provider about:  Any allergies you have.  All medicines you are taking, including vitamins, herbs, eye drops, creams, and over-the-counter medicines.  Any problems you or family members have had with anesthetic medicines.  Any blood disorders you have.  Any surgeries you have had.  Any medical conditions you have.  Whether you are pregnant or may be pregnant. What are the risks? Generally, this is a safe procedure. However, problems may occur, including:  Infection.  Bleeding.  Allergic reactions to medicines.  Damage to other structures or organs.  Blood clots in the legs or lungs.  What happens before the procedure? Staying hydrated Follow instructions from your health care provider about hydration, which may include:  Up to 2 hours before the procedure - you may continue to drink clear liquids, such as water, clear fruit juice, black coffee, and plain tea.  Eating and drinking restrictions Follow instructions from  your health care provider about eating and drinking, which may include:  8 hours before the procedure - stop eating heavy meals or foods such as meat, fried foods, or fatty foods.  6 hours before the procedure - stop eating light meals or foods, such as toast or cereal.  6 hours before the procedure - stop drinking milk or drinks that contain milk.  2 hours before the procedure - stop drinking clear liquids.  Medicines  Ask your health care provider  about: ? Changing or stopping your regular medicines. This is especially important if you are taking diabetes medicines or blood thinners. ? Taking medicines such as aspirin and ibuprofen. These medicines can thin your blood. Do not take these medicines before your procedure if your health care provider instructs you not to.  You may be given antibiotic medicine to help prevent infection. General instructions  Do not smoke for at least 2 weeks before your procedure. If you need help quitting, ask your health care provider.  You may have an exam or tests, such as an electrocardiogram (ECG).  You may have a blood or urine sample taken.  Ask your health care provider: ? Whether you should stop removing hair from your surgical area. ? How your surgical site will be marked or identified.  You may be asked to shower with a germ-killing soap.  Plan to have someone take you home from the hospital or clinic.  If you will be going home right after the procedure, plan to have someone with you for 24 hours. What happens during the procedure?  To reduce your risk of infection: ? Your health care team will wash or sanitize their hands. ? Hair may be removed from the surgical area. ? Your skin will be washed with soap.  An IV tube will be inserted into one of your veins.  You will be given a medicine to make you fall asleep (general anesthetic). You may also be given a medicine to help you relax (sedative).  A thin tube (catheter) may be inserted through your urethra and into your bladder to drain urine during your procedure.  Depending on the type of procedure you are having, one incision or several small incisions will be made in your abdomen.  Your fallopian tube will be cut and removed from where it attaches to your uterus.  Your blood vessels will be clamped and tied to prevent excess bleeding.  The incision(s) in your abdomen will be closed with stitches (sutures), staples, or skin  glue.  A bandage (dressing) may be placed over your incision(s). The procedure may vary among health care providers and hospitals. What happens after the procedure?  Your blood pressure, heart rate, breathing rate, and blood oxygen level will be monitored until the medicines you were given have worn off.  You may continue to receive fluids and medicines through an IV tube.  You may continue to have a catheter draining your urine.  You may have to wear compression stockings. These stockings help to prevent blood clots and reduce swelling in your legs.  You will be given pain medicine as needed.  Do not drive for 24 hours if you received a sedative. Summary  Salpingectomy is a surgical procedure to remove one of the fallopian tubes.  The procedure may be done with an open incision, with a laparoscope, or with computer-controlled instruments.  Depending on the type of procedure you are having, one incision or several small incisions will be made in your  abdomen.  Your blood pressure, heart rate, breathing rate, and blood oxygen level will be monitored until the medicines you were given have worn off.  Plan to have someone take you home from the hospital or clinic. This information is not intended to replace advice given to you by your health care provider. Make sure you discuss any questions you have with your health care provider. Document Released: 04/27/2009 Document Revised: 07/26/2016 Document Reviewed: 06/02/2013 Elsevier Interactive Patient Education  2018 [place] Hysterectomy A supracervical hysterectomy is surgery to remove the top part of the uterus, but not the cervix. You will no longer have menstrual periods or be able to get pregnant after this surgery. The fallopian tubes and ovaries may also be removed (bilateral salpingo-oophorectomy) during this surgery. This surgery is usually performed using a minimally invasive technique called laparoscopy. This  technique allows the surgery to be done through small incisions. The minimally invasive technique provides benefits such as less pain, less risk of infection, and shorter recovery time. Tell a health care provider about:  Any allergies you have.  All medicines you are taking, including vitamins, herbs, eye drops, creams, and over-the-counter medicines.  Any problems you or family members have had with anesthetic medicines.  Any blood disorders you have.  Any surgeries you have had.  Any medical conditions you have. What are the risks? Generally, this is a safe procedure. However, as with any procedure, complications can occur. Possible complications include:  Bleeding.  Blood clots in the legs or lung.  Infection.  Injury to surrounding organs.  Problems related to anesthesia.  Conversion to an open abdominal surgery.  Additional surgery later to remove the cervix if you have problems with the cervix.  What happens before the procedure?  Ask your health care provider about changing or stopping your regular medicines.  Do not take aspirin or blood thinners (anticoagulants) for 1 week before the surgery, or as directed by your health care provider.  Do not eat or drink anything for 8 hours before the surgery, or as directed by your health care provider.  Quit smoking if you smoke.  Arrange for a ride home after surgery and for someone to help you at home during recovery. What happens during the procedure?  You will be given an antibiotic medicine.  An IV tube will be placed in one of your veins. You will be given medicine to make you sleep (general anesthetic).  A gas (carbon dioxide) will be used to inflate your abdomen. This will allow your surgeon to look inside your abdomen, perform your surgery, and treat any other problems found if necessary.  Three or four small incisions will be made in your abdomen. One of these incisions will be made in the area of your belly  button (navel). A thin, flexible tube with a tiny camera and light on the end of it (laparoscope) will be inserted into the incision. The camera on the laparoscope sends a picture to a TV screen in the operating room. This gives your surgeon a good view inside the abdomen.  Other surgical instruments will be inserted through the other incisions.  The uterus will be cut into small pieces and removed through the small incisions.  Your incisions will be closed. What happens after the procedure?  You will be taken to a recovery area where your progress will be monitored until you are awake, stable, and taking fluids well. If there are no other problems, you will then be moved  to a regular hospital room, or you will be allowed to go home.  You will likely have minimal discomfort after the surgery because the incisions are so small with the laparoscopic technique.  You will be given pain medicine while you are in the hospital and for when you go home.  If a bilateral salpingo-oophorectomy was performed before menopause, you will go through a sudden (abrupt) menopause. This can be helped with hormone medicines. This information is not intended to replace advice given to you by your health care provider. Make sure you discuss any questions you have with your health care provider. Document Released: 05/27/2008 Document Revised: 05/16/2016 Document Reviewed: 06/11/2013 Elsevier Interactive Patient Education  2018 [place] Hysterectomy, Care After Refer to this sheet in the next few weeks. These instructions provide you with information on caring for yourself after your procedure. Your health care provider may also give you more specific instructions. Your treatment has been planned according to current medical practices, but problems sometimes occur. Call your health care provider if you have any problems or questions after your procedure. What can I expect after the procedure? After  your procedure, it is typical to have some discomfort, tenderness, swelling, and bruising at the surgical sites. This normally lasts for about 2 weeks. Follow these instructions at home:  Get plenty of rest and sleep.  Only take over-the-counter or prescription medicines as directed by your health care provider.  Do not take aspirin. It can cause bleeding.  Do not drive until your health care provider approves.  Follow your health care provider's advice regarding exercise, lifting, and general activities.  Resume your usual diet as directed by your health care provider.  Do not douche, use tampons, or have sexual intercourse for at least 6 weeks or until your health care provider gives you permission.  Change your bandages (dressings) only as directed by your health care provider.  Monitor your temperature.  Take showers instead of baths for 2-3 weeks or as directed by your health care provider.  Drink enough fluids to keep your urine clear or pale yellow.  Do not drink alcohol until your health care provider gives you permission.  If you are constipated, you may take a mild laxative if your health care provider approves. Bran foods may also help with constipation problems.  Try to have someone home with you for 1-2 weeks to help with activities.  Follow up with your health care provider as directed. Contact a health care provider if:  You have swelling, redness, or increasing pain in the incision area.  You have pus coming from an incision.  You notice a bad smell coming from the incision or dressing.  You have swelling, redness, or pain in the area around the IV site.  Your incision breaks open.  You feel dizzy or lightheaded.  You have pain or bleeding when you urinate.  You have persistent diarrhea.  You have persistent nausea and vomiting.  You have abnormal vaginal discharge.  You have a rash.  Your pain is not controlled with your prescribed  medicine. Get help right away if:  You have a fever.  You have severe abdominal pain.  You have chest pain.  You have shortness of breath.  You faint.  You have pain, swelling, or redness in your leg.  You have heavy vaginal bleeding with blood clots. This information is not intended to replace advice given to you by your health care provider. Make sure you discuss any  questions you have with your health care provider. Document Released: 09/29/2013 Document Revised: 05/16/2016 Document Reviewed: 06/11/2013 Elsevier Interactive Patient Education  2018 [place] Anesthesia, Adult General anesthesia is the use of medicines to make a person "go to sleep" (be unconscious) for a medical procedure. General anesthesia is often recommended when a procedure:  Is long.  Requires you to be still or in an unusual position.  Is major and can cause you to lose blood.  Is impossible to do without general anesthesia.  The medicines used for general anesthesia are called general anesthetics. In addition to making you sleep, the medicines:  Prevent pain.  Control your blood pressure.  Relax your muscles.  Tell a health care provider about:  Any allergies you have.  All medicines you are taking, including vitamins, herbs, eye drops, creams, and over-the-counter medicines.  Any problems you or family members have had with anesthetic medicines.  Types of anesthetics you have had in the past.  Any bleeding disorders you have.  Any surgeries you have had.  Any medical conditions you have.  Any history of heart or lung conditions, such as heart failure, sleep apnea, or chronic obstructive pulmonary disease (COPD).  Whether you are pregnant or may be pregnant.  Whether you use tobacco, alcohol, marijuana, or street drugs.  Any history of Armed forces logistics/support/administrative officer.  Any history of depression or anxiety. What are the risks? Generally, this is a safe procedure. However,  problems may occur, including:  Allergic reaction to anesthetics.  Lung and heart problems.  Inhaling food or liquids from your stomach into your lungs (aspiration).  Injury to nerves.  Waking up during your procedure and being unable to move (rare).  Extreme agitation or a state of mental confusion (delirium) when you wake up from the anesthetic.  Air in the bloodstream, which can lead to stroke.  These problems are more likely to develop if you are having a major surgery or if you have an advanced medical condition. You can prevent some of these complications by answering all of your health care provider's questions thoroughly and by following all pre-procedure instructions. General anesthesia can cause side effects, including:  Nausea or vomiting  A sore throat from the breathing tube.  Feeling cold or shivery.  Feeling tired, washed out, or achy.  Sleepiness or drowsiness.  Confusion or agitation.  What happens before the procedure? Staying hydrated Follow instructions from your health care provider about hydration, which may include:  Up to 2 hours before the procedure - you may continue to drink clear liquids, such as water, clear fruit juice, black coffee, and plain tea.  Eating and drinking restrictions Follow instructions from your health care provider about eating and drinking, which may include:  8 hours before the procedure - stop eating heavy meals or foods such as meat, fried foods, or fatty foods.  6 hours before the procedure - stop eating light meals or foods, such as toast or cereal.  6 hours before the procedure - stop drinking milk or drinks that contain milk.  2 hours before the procedure - stop drinking clear liquids.  Medicines  Ask your health care provider about: ? Changing or stopping your regular medicines. This is especially important if you are taking diabetes medicines or blood thinners. ? Taking medicines such as aspirin and  ibuprofen. These medicines can thin your blood. Do not take these medicines before your procedure if your health care provider instructs you not to. ? Taking new  dietary supplements or medicines. Do not take these during the week before your procedure unless your health care provider approves them.  If you are told to take a medicine or to continue taking a medicine on the day of the procedure, take the medicine with sips of water. General instructions   Ask if you will be going home the same day, the following day, or after a longer hospital stay. ? Plan to have someone take you home. ? Plan to have someone stay with you for the first 24 hours after you leave the hospital or clinic.  For 3-6 weeks before the procedure, try not to use any tobacco products, such as cigarettes, chewing tobacco, and e-cigarettes.  You may brush your teeth on the morning of the procedure, but make sure to spit out the toothpaste. What happens during the procedure?  You will be given anesthetics through a mask and through an IV tube in one of your veins.  You may receive medicine to help you relax (sedative).  As soon as you are asleep, a breathing tube may be used to help you breathe.  An anesthesia specialist will stay with you throughout the procedure. He or she will help keep you comfortable and safe by continuing to give you medicines and adjusting the amount of medicine that you get. He or she will also watch your blood pressure, pulse, and oxygen levels to make sure that the anesthetics do not cause any problems.  If a breathing tube was used to help you breathe, it will be removed before you wake up. The procedure may vary among health care providers and hospitals. What happens after the procedure?  You will wake up, often slowly, after the procedure is complete, usually in a recovery area.  Your blood pressure, heart rate, breathing rate, and blood oxygen level will be monitored until the medicines  you were given have worn off.  You may be given medicine to help you calm down if you feel anxious or agitated.  If you will be going home the same day, your health care provider may check to make sure you can stand, drink, and urinate.  Your health care providers will treat your pain and side effects before you go home.  Do not drive for 24 hours if you received a sedative.  You may: ? Feel nauseous and vomit. ? Have a sore throat. ? Have mental slowness. ? Feel cold or shivery. ? Feel sleepy. ? Feel tired. ? Feel sore or achy, even in parts of your body where you did not have surgery. This information is not intended to replace advice given to you by your health care provider. Make sure you discuss any questions you have with your health care provider. Document Released: 03/17/2008 Document Revised: 05/21/2016 Document Reviewed: 11/23/2015 Elsevier Interactive Patient Education  2018 [place] Anesthesia, Adult, Care After These instructions provide you with information about caring for yourself after your procedure. Your health care provider may also give you more specific instructions. Your treatment has been planned according to current medical practices, but problems sometimes occur. Call your health care provider if you have any problems or questions after your procedure. What can I expect after the procedure? After the procedure, it is common to have:  Vomiting.  A sore throat.  Mental slowness.  It is common to feel:  Nauseous.  Cold or shivery.  Sleepy.  Tired.  Sore or achy, even in parts of your body where you did  not have surgery.  Follow these instructions at home: For at least 24 hours after the procedure:  Do not: ? Participate in activities where you could fall or become injured. ? Drive. ? Use heavy machinery. ? Drink alcohol. ? Take sleeping pills or medicines that cause drowsiness. ? Make important decisions or sign legal  documents. ? Take care of children on your own.  Rest. Eating and drinking  If you vomit, drink water, juice, or soup when you can drink without vomiting.  Drink enough fluid to keep your urine clear or pale yellow.  Make sure you have little or no nausea before eating solid foods.  Follow the diet recommended by your health care provider. General instructions  Have a responsible adult stay with you until you are awake and alert.  Return to your normal activities as told by your health care provider. Ask your health care provider what activities are safe for you.  Take over-the-counter and prescription medicines only as told by your health care provider.  If you smoke, do not smoke without supervision.  Keep all follow-up visits as told by your health care provider. This is important. Contact a health care provider if:  You continue to have nausea or vomiting at home, and medicines are not helpful.  You cannot drink fluids or start eating again.  You cannot urinate after 8-12 hours.  You develop a skin rash.  You have fever.  You have increasing redness at the site of your procedure. Get help right away if:  You have difficulty breathing.  You have chest pain.  You have unexpected bleeding.  You feel that you are having a life-threatening or urgent problem. This information is not intended to replace advice given to you by your health care provider. Make sure you discuss any questions you have with your health care provider. Document Released: 03/17/2001 Document Revised: 05/13/2016 Document Reviewed: 11/23/2015 Elsevier Interactive Patient Education  [employer].

## 2017-09-18 NOTE — Discharge Instructions (Signed)
Dysfunctional Uterine Bleeding °Dysfunctional uterine bleeding is abnormal bleeding from the uterus. Dysfunctional uterine bleeding includes: °· A period that comes earlier or later than usual. °· A period that is lighter, heavier, or has blood clots. °· Bleeding between periods. °· Skipping one or more periods. °· Bleeding after sexual intercourse. °· Bleeding after menopause. ° °Follow these instructions at home: °Pay attention to any changes in your symptoms. Follow these instructions to help with your condition: °Eating and drinking °· Eat well-balanced meals. Include foods that are high in iron, such as liver, meat, shellfish, green leafy vegetables, and eggs. °· If you become constipated: °? Drink plenty of water. °? Eat fruits and vegetables that are high in water and fiber, such as spinach, carrots, raspberries, apples, and mango. °Medicines °· Take over-the-counter and prescription medicines only as told by your health care provider. °· Do not change medicines without talking with your health care provider. °· Aspirin or medicines that contain aspirin may make the bleeding worse. Do not take those medicines: °? During the week before your period. °? During your period. °· If you were prescribed iron pills, take them as told by your health care provider. Iron pills help to replace iron that your body loses because of this condition. °Activity °· If you need to change your sanitary pad or tampon more than one time every 2 hours: °? Lie in bed with your feet raised (elevated). °? Place a cold pack on your lower abdomen. °? Rest as much as possible until the bleeding stops or slows down. °· Do not try to lose weight until the bleeding has stopped and your blood iron level is back to normal. °Other Instructions °· For two months, write down: °? When your period starts. °? When your period ends. °? When any abnormal bleeding occurs. °? What problems you notice. °· Keep all follow up visits as told by your health  care provider. This is important. °Contact a health care provider if: °· You get light-headed or weak. °· You have nausea and vomiting. °· You cannot eat or drink without vomiting. °· You feel dizzy or have diarrhea while you are taking medicines. °· You are taking birth control pills or hormones, and you want to change them or stop taking them. °Get help right away if: °· You develop a fever or chills. °· You need to change your sanitary pad or tampon more than one time per hour. °· Your bleeding becomes heavier, or your flow contains clots more often. °· You develop pain in your abdomen. °· You lose consciousness. °· You develop a rash. °This information is not intended to replace advice given to you by your health care provider. Make sure you discuss any questions you have with your health care provider. °Document Released: 12/06/2000 Document Revised: 05/16/2016 Document Reviewed: 03/06/2015 °Elsevier Interactive Patient Education © 2018 [employer] ° °

## 2017-09-18 NOTE — Discharge Summary (Signed)
Physician Discharge Summary  Patient ID: Holly Hamilton MRN: [id number] DOB/AGE: 09/15/1971 46 y.o.  Admit date: 09/17/2017 Discharge date: 09/18/2017  Admission Diagnoses: Menometrorrhagia Symptomatic anemia Fibroids S/P endometrial ablation  Discharge Diagnoses:  Active Problems:   Fe deficiency anemia   Discharged Condition: good  Hospital Course: transfused 4 units PRBC, Iv premarin and oral megestrol Will try to move up patient's scheduled surgery due to her continued bleeding with anemia  Consults:   Significant Diagnostic Studies: labs:  CBC Latest Ref Rng & Units 09/17/2017 09/03/2017 07/21/2017  WBC 4.0 - 10.5 K/uL 9.2 6.8 6.8  Hemoglobin 12.0 - 15.0 g/dL 5.8(LL) 8.0(L) 9.2(L)  Hematocrit 36.0 - 46.0 % 19.5(L) 27.9(L) 29.1(L)  Platelets 150 - 400 K/uL 373 427(H) 442(H)     Treatments: transfusion and IV premarin  Discharge Exam: Blood pressure 124/72, pulse 89, temperature 98.2 F (36.8 C), temperature source Oral, resp. rate 18, height 5\' 4"  (1.626 m), weight 177 lb (80.3 kg), SpO2 100 %. Gen WDWN NAD Abdomen soft non tender  Disposition: 01-Home or Self Care  Discharge Instructions    Call MD for:  persistant nausea and vomiting    Complete by:  As directed    Call MD for:  severe uncontrolled pain    Complete by:  As directed    Call MD for:  temperature >100.4    Complete by:  As directed    Diet - low sodium heart healthy    Complete by:  As directed    Driving Restrictions    Complete by:  As directed    No restricitons   Increase activity slowly    Complete by:  As directed    Lifting restrictions    Complete by:  As directed    No restricitons   Sexual Activity Restrictions    Complete by:  As directed    No restricitions     Allergies as of 09/18/2017   No Active Allergies     Medication List    STOP taking these medications   ferrous sulfate 325 (65 FE) MG tablet Commonly known as:  FERROUSUL   ibuprofen 800 MG tablet Commonly  known as:  ADVIL,MOTRIN   tranexamic acid 650 MG Tabs tablet Commonly known as:  LYSTEDA     TAKE these medications   Cranberry 500 MG Caps Take 500 mg by mouth.   cyclobenzaprine 5 MG tablet Commonly known as:  FLEXERIL Take 5 mg by mouth 3 (three) times daily as needed for muscle spasms.   hydrochlorothiazide 25 MG tablet Commonly known as:  HYDRODIURIL Take 25 mg by mouth daily.   INTEGRA F 125-1 MG Caps Take 1 capsule by mouth daily.   levothyroxine 112 MCG tablet Commonly known as:  SYNTHROID, LEVOTHROID Take 1.5 tablets (168 mcg total) by mouth daily before breakfast.   megestrol 40 MG tablet Commonly known as:  MEGACE Take 3 tablets (120 mg total) by mouth daily. What changed:  how much to take  when to take this   multivitamin tablet Take 1 tablet by mouth daily.   oxyCODONE-acetaminophen 5-325 MG tablet Commonly known as:  PERCOCET/ROXICET Take 1-2 tablets by mouth every 6 (six) hours as needed for moderate pain or severe pain. What changed:  Another medication with the same name was removed. Continue taking this medication, and follow the directions you see here.            Discharge Care Instructions        Start  Ordered   09/18/17 0000  megestrol (MEGACE) 40 MG tablet  Daily     09/18/17 0731   09/18/17 0000  Increase activity slowly     09/18/17 0731   09/18/17 0000  Diet - low sodium heart healthy     09/18/17 0731   09/18/17 0000  Driving Restrictions    Comments:  No restricitons   09/18/17 0731   09/18/17 0000  Lifting restrictions    Comments:  No restricitons   09/18/17 0731   09/18/17 0000  Sexual Activity Restrictions    Comments:  No restricitions   09/18/17 0731   09/18/17 0000  Call MD for:  temperature >100.4     09/18/17 0731   09/18/17 0000  Call MD for:  persistant nausea and vomiting     09/18/17 0731   09/18/17 0000  Call MD for:  severe uncontrolled pain     09/18/17 0731     Follow-up Information    Florian Buff, MD Follow up.   Specialties:  Obstetrics and Gynecology, Radiology Why:  I will be working on scheduling her surgery for next week Contact information: [place] [address] [phone]           Signed: Florian Buff 09/18/2017, 7:32 AM

## 2017-09-18 NOTE — Progress Notes (Signed)
Discharge instructions reviewed with patient including follow up care.  Reviewed signs and symptoms of post transfusion reactions.  Patient's questions answered, verbalization of understanding. Paperwork signed and patient ambulated off unit with NT and family.

## 2017-09-18 NOTE — Progress Notes (Signed)
Late entry for 09/17/17 7p-7a  Shift summary:  Pt alert and oriented. Able to verbalize needs to staff. Pt received 2 units of PRBCs this shift with the last unit ending at 0245. Tolerated well. No reactions noted.  Pt complained of headache this shift and Tylenol Extra strength 2 tabs was ordered. Pt reported headache was gone. Pt then complained of severe abdomen cramping. Told that was possibly a side effect of the Lysteda. Notified MD on call. New order for Toradol 30mg  IV once. Given and pt stated she felt so much better. Pt rested comfortably during the night. Linens changed at 0300 with noted bleeding. Pt up to BR and able to perform pericare and pad changes independently. PRN Premarin also given one time this shift after blood was completed. IV iron given as well. No acute distress noted. Will continue to monitor.

## 2017-09-19 ENCOUNTER — Encounter (HOSPITAL_COMMUNITY)
Admission: RE | Admit: 2017-09-19 | Discharge: 2017-09-19 | Disposition: A | Discharge status: Home / Self Care | Payer: 59 | Source: Ambulatory Visit | Attending: Obstetrics & Gynecology | Admitting: Obstetrics & Gynecology

## 2017-09-19 ENCOUNTER — Encounter (HOSPITAL_COMMUNITY): Payer: Self-pay

## 2017-09-19 ENCOUNTER — Other Ambulatory Visit: Payer: Self-pay | Admitting: Obstetrics and Gynecology

## 2017-09-19 DIAGNOSIS — Z9889 Other specified postprocedural states: Secondary | ICD-10-CM | POA: Diagnosis not present

## 2017-09-19 DIAGNOSIS — N920 Excessive and frequent menstruation with regular cycle: Secondary | ICD-10-CM | POA: Diagnosis not present

## 2017-09-19 DIAGNOSIS — N921 Excessive and frequent menstruation with irregular cycle: Secondary | ICD-10-CM

## 2017-09-19 DIAGNOSIS — Z01818 Encounter for other preprocedural examination: Secondary | ICD-10-CM | POA: Diagnosis not present

## 2017-09-19 DIAGNOSIS — D649 Anemia, unspecified: Secondary | ICD-10-CM | POA: Insufficient documentation

## 2017-09-19 HISTORY — DX: Cerebral infarction, unspecified: I63.9

## 2017-09-19 HISTORY — DX: Adverse effect of unspecified anesthetic, initial encounter: T41.45XA

## 2017-09-19 HISTORY — DX: Other complications of anesthesia, initial encounter: T88.59XA

## 2017-09-19 LAB — BPAM RBC
BLOOD PRODUCT EXPIRATION DATE: 201810052359
BLOOD PRODUCT EXPIRATION DATE: 201810122359
Blood Product Expiration Date: 201810122359
Blood Product Expiration Date: 201810122359
ISSUE DATE / TIME: 201809261457
ISSUE DATE / TIME: 201809262058
ISSUE DATE / TIME: 201809261234
ISSUE DATE / TIME: 201809270001
UNIT TYPE AND RH: 9500
Unit Type and Rh: 5100
Unit Type and Rh: 5100
Unit Type and Rh: 5100

## 2017-09-19 LAB — COMPREHENSIVE METABOLIC PANEL
ALT: 61 U/L — AB (ref 14–54)
AST: 69 U/L — AB (ref 15–41)
Albumin: 3.3 g/dL — ABNORMAL LOW (ref 3.5–5.0)
Alkaline Phosphatase: 60 U/L (ref 38–126)
Anion gap: 7 (ref 5–15)
BILIRUBIN TOTAL: 0.6 mg/dL (ref 0.3–1.2)
BUN: 8 mg/dL (ref 6–20)
CHLORIDE: 104 mmol/L (ref 101–111)
CO2: 25 mmol/L (ref 22–32)
Calcium: 8.9 mg/dL (ref 8.9–10.3)
Creatinine, Ser: 0.82 mg/dL (ref 0.44–1.00)
GFR calc Af Amer: >60 mL/min (ref >60)
Glucose, Bld: 72 mg/dL (ref 65–99)
Potassium: 3.3 mmol/L — ABNORMAL LOW (ref 3.5–5.1)
Sodium: 136 mmol/L (ref 135–145)
TOTAL PROTEIN: 6.9 g/dL (ref 6.5–8.1)

## 2017-09-19 LAB — TYPE AND SCREEN
ABO/RH(D): O POS
ANTIBODY SCREEN: NEGATIVE
UNIT DIVISION: 0
UNIT DIVISION: 0
Unit division: 0
Unit division: 0

## 2017-09-19 LAB — CBC
HEMATOCRIT: 31.6 % — AB (ref 36.0–46.0)
Hemoglobin: 9.9 g/dL — ABNORMAL LOW (ref 12.0–15.0)
MCH: 25.3 pg — ABNORMAL LOW (ref 26.0–34.0)
MCHC: 31.3 g/dL (ref 30.0–36.0)
MCV: 80.8 fL (ref 78.0–100.0)
Platelets: 338 10*3/uL (ref 150–400)
RBC: 3.91 MIL/uL (ref 3.87–5.11)
RDW: 16.6 % — ABNORMAL HIGH (ref 11.5–15.5)
WBC: 9.6 10*3/uL (ref 4.0–10.5)

## 2017-09-19 LAB — URINALYSIS, ROUTINE W REFLEX MICROSCOPIC
Bilirubin Urine: NEGATIVE
Glucose, UA: NEGATIVE mg/dL
Ketones, ur: NEGATIVE mg/dL
Nitrite: NEGATIVE
PH: 5 (ref 5.0–8.0)
Protein, ur: 100 mg/dL — AB
SPECIFIC GRAVITY, URINE: 1.03 (ref 1.005–1.030)
SQUAMOUS EPITHELIAL / LPF: NONE SEEN

## 2017-09-19 LAB — HCG, SERUM, QUALITATIVE: PREG SERUM: NEGATIVE

## 2017-09-19 LAB — PREPARE RBC (CROSSMATCH)

## 2017-09-19 MED ORDER — SODIUM CHLORIDE 0.9 % IV SOLN: Freq: Once | INTRAVENOUS | Status: DC

## 2017-09-19 MED ORDER — DOXYCYCLINE HYCLATE 100 MG IV SOLR: 200.0000 mg | INTRAVENOUS | Status: DC

## 2017-09-20 LAB — ABO/RH: ABO/RH(D): O POS

## 2017-09-20 LAB — RPR: RPR: NONREACTIVE

## 2017-09-22 ENCOUNTER — Other Ambulatory Visit: Payer: Self-pay | Admitting: Obstetrics & Gynecology

## 2017-09-23 ENCOUNTER — Inpatient Hospital Stay (HOSPITAL_COMMUNITY): Payer: 59 | Admitting: Anesthesiology

## 2017-09-24 ENCOUNTER — Other Ambulatory Visit: Payer: Self-pay | Admitting: Obstetrics & Gynecology

## 2017-09-24 ENCOUNTER — Encounter (HOSPITAL_COMMUNITY): Payer: Self-pay | Admitting: Anesthesiology

## 2017-09-24 ENCOUNTER — Encounter (HOSPITAL_COMMUNITY)
Admission: RE | Disposition: A | Discharge status: Home / Self Care | Payer: Self-pay | Source: Ambulatory Visit | Attending: Obstetrics & Gynecology

## 2017-09-24 ENCOUNTER — Ambulatory Visit (HOSPITAL_COMMUNITY)
Admission: RE | Admit: 2017-09-24 | Discharge: 2017-09-24 | Disposition: A | Discharge status: Home / Self Care | Payer: 59 | Source: Ambulatory Visit | Attending: Obstetrics & Gynecology | Admitting: Obstetrics & Gynecology

## 2017-09-24 DIAGNOSIS — D649 Anemia, unspecified: Secondary | ICD-10-CM | POA: Insufficient documentation

## 2017-09-24 DIAGNOSIS — Z8673 Personal history of transient ischemic attack (TIA), and cerebral infarction without residual deficits: Secondary | ICD-10-CM | POA: Diagnosis not present

## 2017-09-24 DIAGNOSIS — N939 Abnormal uterine and vaginal bleeding, unspecified: Secondary | ICD-10-CM | POA: Diagnosis not present

## 2017-09-24 DIAGNOSIS — Z01812 Encounter for preprocedural laboratory examination: Secondary | ICD-10-CM | POA: Insufficient documentation

## 2017-09-24 DIAGNOSIS — D259 Leiomyoma of uterus, unspecified: Secondary | ICD-10-CM | POA: Diagnosis not present

## 2017-09-24 DIAGNOSIS — D509 Iron deficiency anemia, unspecified: Secondary | ICD-10-CM | POA: Diagnosis not present

## 2017-09-24 DIAGNOSIS — I10 Essential (primary) hypertension: Secondary | ICD-10-CM | POA: Diagnosis not present

## 2017-09-24 LAB — POCT I-STAT 4, (NA,K, GLUC, HGB,HCT)
GLUCOSE: 82 mg/dL (ref 65–99)
HEMATOCRIT: 32 % — AB (ref 36.0–46.0)
Hemoglobin: 10.9 g/dL — ABNORMAL LOW (ref 12.0–15.0)
Potassium: 2.9 mmol/L — ABNORMAL LOW (ref 3.5–5.1)
Sodium: 143 mmol/L (ref 135–145)

## 2017-09-24 LAB — BASIC METABOLIC PANEL
Anion gap: 9 (ref 5–15)
BUN: 14 mg/dL (ref 6–20)
CALCIUM: 8.9 mg/dL (ref 8.9–10.3)
CHLORIDE: 106 mmol/L (ref 101–111)
CO2: 23 mmol/L (ref 22–32)
CREATININE: 0.84 mg/dL (ref 0.44–1.00)
GFR calc non Af Amer: >60 mL/min (ref >60)
Glucose, Bld: 81 mg/dL (ref 65–99)
Potassium: 2.8 mmol/L — ABNORMAL LOW (ref 3.5–5.1)
Sodium: 138 mmol/L (ref 135–145)

## 2017-09-24 SURGERY — HYSTERECTOMY, SUPRACERVICAL, ABDOMINAL
Anesthesia: General

## 2017-09-24 MED ORDER — SODIUM CHLORIDE 0.9 % IJ SOLN
INTRAMUSCULAR | Status: AC
  Filled 2017-09-24: qty 10

## 2017-09-24 MED ORDER — CEFAZOLIN SODIUM-DEXTROSE 2-4 GM/100ML-% IV SOLN: 2.0000 g | INTRAVENOUS | Status: DC

## 2017-09-24 MED ORDER — BUPIVACAINE LIPOSOME 1.3 % IJ SUSP
20.0000 mL | Freq: Once | INTRAMUSCULAR | Status: DC
  Filled 2017-09-24: qty 20

## 2017-09-24 MED ORDER — EPHEDRINE SULFATE 50 MG/ML IJ SOLN
INTRAMUSCULAR | Status: AC
  Filled 2017-09-24: qty 1

## 2017-09-24 MED ORDER — POTASSIUM CHLORIDE CRYS ER 20 MEQ PO TBCR: 40.0000 meq | EXTENDED_RELEASE_TABLET | Freq: Two times a day (BID) | ORAL | 11 refills | Status: DC

## 2017-09-24 MED ORDER — TRIAMTERENE-HCTZ 37.5-25 MG PO CAPS: 1.0000 | ORAL_CAPSULE | Freq: Every day | ORAL | 11 refills | Status: AC

## 2017-09-24 MED ORDER — PROPOFOL 10 MG/ML IV BOLUS
INTRAVENOUS | Status: AC
  Filled 2017-09-24: qty 40

## 2017-09-24 MED ORDER — ROCURONIUM BROMIDE 50 MG/5ML IV SOLN
INTRAVENOUS | Status: AC
  Filled 2017-09-24: qty 1

## 2017-09-24 MED ORDER — MIDAZOLAM HCL 2 MG/2ML IJ SOLN: 1.0000 mg | INTRAMUSCULAR | Status: AC

## 2017-09-24 MED ORDER — ARTIFICIAL TEARS OPHTHALMIC OINT
TOPICAL_OINTMENT | OPHTHALMIC | Status: AC
  Filled 2017-09-24: qty 7

## 2017-09-24 MED ORDER — LIDOCAINE HCL (PF) 1 % IJ SOLN
INTRAMUSCULAR | Status: AC
  Filled 2017-09-24: qty 5

## 2017-09-24 MED ORDER — KETOROLAC TROMETHAMINE 30 MG/ML IJ SOLN: 30.0000 mg | Freq: Once | INTRAMUSCULAR | Status: DC

## 2017-09-24 MED ORDER — FENTANYL CITRATE (PF) 250 MCG/5ML IJ SOLN
INTRAMUSCULAR | Status: AC
  Filled 2017-09-24: qty 5

## 2017-09-24 MED ORDER — SUCCINYLCHOLINE CHLORIDE 20 MG/ML IJ SOLN
INTRAMUSCULAR | Status: AC
  Filled 2017-09-24: qty 1

## 2017-09-24 MED ORDER — LACTATED RINGERS IV SOLN
INTRAVENOUS | Status: DC
  Administered 2017-09-24: 1000 mL via INTRAVENOUS

## 2017-09-24 MED ORDER — PHENYLEPHRINE 40 MCG/ML (10ML) SYRINGE FOR IV PUSH (FOR BLOOD PRESSURE SUPPORT)
PREFILLED_SYRINGE | INTRAVENOUS | Status: AC
  Filled 2017-09-24: qty 10

## 2017-09-24 NOTE — Anesthesia Preprocedure Evaluation (Signed)
Anesthesia Evaluation  Patient identified by MRN, date of birth, ID band Patient awake    Airway Mallampati: II  TM Distance: >3 FB Neck ROM: Full    Dental no notable dental hx. (+) Poor Dentition, Missing, Chipped, Dental Advisory Given,    Pulmonary neg pulmonary ROS,    Pulmonary exam normal breath sounds clear to auscultation       Cardiovascular hypertension, Normal cardiovascular exam Rhythm:Regular Rate:Normal     Neuro/Psych  Headaches, negative neurological ROS  negative psych ROS   GI/Hepatic negative GI ROS, Neg liver ROS,   Endo/Other  Hyperthyroidism ( s/p resection)   Renal/GU negative Renal ROS     Musculoskeletal negative musculoskeletal ROS (+)   Abdominal Normal abdominal exam  (+)   Peds  Hematology  (+) Blood dyscrasia, anemia ,   Anesthesia Other Findings   Reproductive/Obstetrics                             Anesthesia Physical Anesthesia Plan  ASA: III  Anesthesia Plan: General   Post-op Pain Management:    Induction: Intravenous  PONV Risk Score and Plan:   Airway Management Planned: Oral ETT  Additional Equipment:   Intra-op Plan:   Post-operative Plan: Extubation in OR  Informed Consent: I have reviewed the patients History and Physical, chart, labs and discussed the procedure including the risks, benefits and alternatives for the proposed anesthesia with the patient or authorized representative who has indicated his/her understanding and acceptance.     Plan Discussed with:   Anesthesia Plan Comments:         Anesthesia Quick Evaluation

## 2017-09-24 NOTE — Discharge Instructions (Signed)
Dr. Brynda Greathouse office will notify you of your rescheduled surgery and someone will call you previous to the date with information with time of arrival and to go over pre-op assessment. Family and patient present. Call Dr. Elonda Husky if symptoms require.

## 2017-09-24 NOTE — Progress Notes (Addendum)
Istat done on admission K+ 2.9 Hgb 10.9. Results given to Dr. Patsey Berthold, lab draw done to confirm K+ 2.8. Dr Elonda Husky called and beeped at 1040  64- Dr Elonda Husky in to see and talk with patient, decision made to postpone surgery for potassium supplements for 1 week. Instructions given to patient and family, medication called in to [facility]. Discharged to home with family.

## 2017-09-30 ENCOUNTER — Encounter (HOSPITAL_COMMUNITY)
Admission: RE | Admit: 2017-09-30 | Discharge: 2017-09-30 | Disposition: A | Discharge status: Home / Self Care | Payer: 59 | Source: Ambulatory Visit | Attending: Obstetrics & Gynecology | Admitting: Obstetrics & Gynecology

## 2017-09-30 ENCOUNTER — Other Ambulatory Visit: Payer: Self-pay | Admitting: Obstetrics & Gynecology

## 2017-09-30 ENCOUNTER — Encounter (HOSPITAL_COMMUNITY): Payer: Self-pay

## 2017-10-01 ENCOUNTER — Encounter (HOSPITAL_COMMUNITY)
Admission: RE | Disposition: A | Discharge status: Home / Self Care | Payer: Self-pay | Source: Ambulatory Visit | Attending: Obstetrics & Gynecology

## 2017-10-01 ENCOUNTER — Encounter (HOSPITAL_COMMUNITY): Payer: Self-pay | Admitting: *Deleted

## 2017-10-01 ENCOUNTER — Inpatient Hospital Stay (HOSPITAL_COMMUNITY)
Admission: RE | Admit: 2017-10-01 | Discharge: 2017-10-02 | DRG: 742 | Disposition: A | Discharge status: Home / Self Care | Payer: 59 | Source: Ambulatory Visit | Attending: Obstetrics & Gynecology | Admitting: Obstetrics & Gynecology

## 2017-10-01 ENCOUNTER — Inpatient Hospital Stay (HOSPITAL_COMMUNITY): Payer: 59 | Admitting: Anesthesiology

## 2017-10-01 DIAGNOSIS — Z90710 Acquired absence of both cervix and uterus: Secondary | ICD-10-CM | POA: Diagnosis present

## 2017-10-01 DIAGNOSIS — Z9889 Other specified postprocedural states: Secondary | ICD-10-CM

## 2017-10-01 DIAGNOSIS — D509 Iron deficiency anemia, unspecified: Secondary | ICD-10-CM | POA: Diagnosis present

## 2017-10-01 DIAGNOSIS — Z8673 Personal history of transient ischemic attack (TIA), and cerebral infarction without residual deficits: Secondary | ICD-10-CM

## 2017-10-01 DIAGNOSIS — N802 Endometriosis of fallopian tube: Secondary | ICD-10-CM | POA: Diagnosis present

## 2017-10-01 DIAGNOSIS — D250 Submucous leiomyoma of uterus: Secondary | ICD-10-CM | POA: Diagnosis present

## 2017-10-01 DIAGNOSIS — I10 Essential (primary) hypertension: Secondary | ICD-10-CM | POA: Diagnosis present

## 2017-10-01 DIAGNOSIS — N921 Excessive and frequent menstruation with irregular cycle: Secondary | ICD-10-CM | POA: Diagnosis not present

## 2017-10-01 DIAGNOSIS — Z9089 Acquired absence of other organs: Secondary | ICD-10-CM

## 2017-10-01 DIAGNOSIS — D251 Intramural leiomyoma of uterus: Secondary | ICD-10-CM | POA: Diagnosis not present

## 2017-10-01 DIAGNOSIS — D500 Iron deficiency anemia secondary to blood loss (chronic): Secondary | ICD-10-CM | POA: Diagnosis not present

## 2017-10-01 DIAGNOSIS — D252 Subserosal leiomyoma of uterus: Secondary | ICD-10-CM | POA: Diagnosis present

## 2017-10-01 DIAGNOSIS — D62 Acute posthemorrhagic anemia: Secondary | ICD-10-CM | POA: Diagnosis present

## 2017-10-01 HISTORY — PX: BILATERAL SALPINGECTOMY: SHX5743

## 2017-10-01 HISTORY — PX: SUPRACERVICAL ABDOMINAL HYSTERECTOMY: SHX5393

## 2017-10-01 LAB — BPAM RBC
Blood Product Expiration Date: 201810152359
Blood Product Expiration Date: 201810152359
ISSUE DATE / TIME: 201810031039
UNIT TYPE AND RH: 5100
Unit Type and Rh: 5100

## 2017-10-01 LAB — TYPE AND SCREEN
ABO/RH(D): O POS
Antibody Screen: NEGATIVE
UNIT DIVISION: 0
Unit division: 0

## 2017-10-01 LAB — SURGICAL PCR SCREEN
MRSA, PCR: POSITIVE — AB
STAPHYLOCOCCUS AUREUS: POSITIVE — AB

## 2017-10-01 LAB — COMPREHENSIVE METABOLIC PANEL
ALK PHOS: 54 U/L (ref 38–126)
ALT: 26 U/L (ref 14–54)
AST: 31 U/L (ref 15–41)
Albumin: 3.6 g/dL (ref 3.5–5.0)
Anion gap: 8 (ref 5–15)
BUN: 16 mg/dL (ref 6–20)
CALCIUM: 9.1 mg/dL (ref 8.9–10.3)
CO2: 22 mmol/L (ref 22–32)
CREATININE: 0.88 mg/dL (ref 0.44–1.00)
Chloride: 109 mmol/L (ref 101–111)
Glucose, Bld: 89 mg/dL (ref 65–99)
Potassium: 4.3 mmol/L (ref 3.5–5.1)
Sodium: 139 mmol/L (ref 135–145)
Total Bilirubin: 0.4 mg/dL (ref 0.3–1.2)
Total Protein: 7.9 g/dL (ref 6.5–8.1)

## 2017-10-01 LAB — CBC
HEMATOCRIT: 33.2 % — AB (ref 36.0–46.0)
Hemoglobin: 9.8 g/dL — ABNORMAL LOW (ref 12.0–15.0)
MCH: 25.7 pg — ABNORMAL LOW (ref 26.0–34.0)
MCHC: 29.5 g/dL — ABNORMAL LOW (ref 30.0–36.0)
MCV: 86.9 fL (ref 78.0–100.0)
PLATELETS: 504 10*3/uL — AB (ref 150–400)
RBC: 3.82 MIL/uL — ABNORMAL LOW (ref 3.87–5.11)
RDW: 20.4 % — AB (ref 11.5–15.5)
WBC: 7.2 10*3/uL (ref 4.0–10.5)

## 2017-10-01 LAB — HEMOGLOBIN AND HEMATOCRIT, BLOOD
HEMATOCRIT: 30.6 % — AB (ref 36.0–46.0)
HEMOGLOBIN: 9.2 g/dL — AB (ref 12.0–15.0)

## 2017-10-01 LAB — PREPARE RBC (CROSSMATCH)

## 2017-10-01 LAB — HCG, SERUM, QUALITATIVE: PREG SERUM: NEGATIVE

## 2017-10-01 SURGERY — HYSTERECTOMY, SUPRACERVICAL, ABDOMINAL
Anesthesia: General | Site: Abdomen

## 2017-10-01 MED ORDER — KETOROLAC TROMETHAMINE 30 MG/ML IJ SOLN
30.0000 mg | Freq: Three times a day (TID) | INTRAMUSCULAR | Status: AC
  Administered 2017-10-02 (×2): 30 mg via INTRAVENOUS
  Filled 2017-10-01 (×2): qty 1

## 2017-10-01 MED ORDER — FENTANYL CITRATE (PF) 250 MCG/5ML IJ SOLN
INTRAMUSCULAR | Status: AC
  Filled 2017-10-01: qty 5

## 2017-10-01 MED ORDER — MIDAZOLAM HCL 5 MG/5ML IJ SOLN
INTRAMUSCULAR | Status: DC | PRN
  Administered 2017-10-01: 2 mg via INTRAVENOUS

## 2017-10-01 MED ORDER — TRIAMTERENE-HCTZ 37.5-25 MG PO CAPS
1.0000 | ORAL_CAPSULE | Freq: Every day | ORAL | Status: DC
  Filled 2017-10-01 (×3): qty 1

## 2017-10-01 MED ORDER — 0.9 % SODIUM CHLORIDE (POUR BTL) OPTIME
TOPICAL | Status: DC | PRN
  Administered 2017-10-01: 2000 mL

## 2017-10-01 MED ORDER — OXYCODONE-ACETAMINOPHEN 5-325 MG PO TABS
1.0000 | ORAL_TABLET | ORAL | Status: DC | PRN
  Administered 2017-10-01 – 2017-10-02 (×2): 2 via ORAL
  Filled 2017-10-01 (×2): qty 2

## 2017-10-01 MED ORDER — DOCUSATE SODIUM 100 MG PO CAPS
100.0000 mg | ORAL_CAPSULE | Freq: Two times a day (BID) | ORAL | Status: DC
  Administered 2017-10-01 – 2017-10-02 (×3): 100 mg via ORAL
  Filled 2017-10-01 (×3): qty 1

## 2017-10-01 MED ORDER — HEMOSTATIC AGENTS (NO CHARGE) OPTIME
TOPICAL | Status: DC | PRN
  Administered 2017-10-01: 1 via TOPICAL

## 2017-10-01 MED ORDER — SUGAMMADEX SODIUM 500 MG/5ML IV SOLN
INTRAVENOUS | Status: DC | PRN
  Administered 2017-10-01: 160.6 mg via INTRAVENOUS

## 2017-10-01 MED ORDER — CEFAZOLIN SODIUM-DEXTROSE 2-4 GM/100ML-% IV SOLN
2.0000 g | INTRAVENOUS | Status: AC
  Administered 2017-10-01: 2 g via INTRAVENOUS
  Filled 2017-10-01: qty 100

## 2017-10-01 MED ORDER — ONDANSETRON HCL 4 MG PO TABS: 8.0000 mg | ORAL_TABLET | Freq: Four times a day (QID) | ORAL | Status: DC | PRN

## 2017-10-01 MED ORDER — SODIUM CHLORIDE 0.9 % IV SOLN: Freq: Once | INTRAVENOUS | Status: DC

## 2017-10-01 MED ORDER — SODIUM CHLORIDE 0.9 % IV SOLN
510.0000 mg | Freq: Once | INTRAVENOUS | Status: AC
  Administered 2017-10-02: 510 mg via INTRAVENOUS
  Filled 2017-10-01: qty 17

## 2017-10-01 MED ORDER — MIDAZOLAM HCL 2 MG/2ML IJ SOLN
1.0000 mg | INTRAMUSCULAR | Status: DC
Start: 2017-10-01 — End: 2017-10-01
  Administered 2017-10-01: 2 mg via INTRAVENOUS

## 2017-10-01 MED ORDER — FENTANYL CITRATE (PF) 100 MCG/2ML IJ SOLN
INTRAMUSCULAR | Status: DC | PRN
  Administered 2017-10-01 (×8): 50 ug via INTRAVENOUS

## 2017-10-01 MED ORDER — LACTATED RINGERS IV SOLN
INTRAVENOUS | Status: DC
  Administered 2017-10-01 (×2): via INTRAVENOUS

## 2017-10-01 MED ORDER — ZOLPIDEM TARTRATE 5 MG PO TABS: 5.0000 mg | ORAL_TABLET | Freq: Every evening | ORAL | Status: DC | PRN

## 2017-10-01 MED ORDER — CRANBERRY 500 MG PO CAPS: 500.0000 mg | ORAL_CAPSULE | Freq: Every day | ORAL | Status: DC

## 2017-10-01 MED ORDER — SIMETHICONE 80 MG PO CHEW: 80.0000 mg | CHEWABLE_TABLET | Freq: Four times a day (QID) | ORAL | Status: DC | PRN

## 2017-10-01 MED ORDER — ROCURONIUM BROMIDE 100 MG/10ML IV SOLN
INTRAVENOUS | Status: DC | PRN
  Administered 2017-10-01 (×3): 10 mg via INTRAVENOUS
  Administered 2017-10-01: 5 mg via INTRAVENOUS
  Administered 2017-10-01: 35 mg via INTRAVENOUS

## 2017-10-01 MED ORDER — MUPIROCIN 2 % EX OINT
1.0000 "application " | TOPICAL_OINTMENT | Freq: Two times a day (BID) | CUTANEOUS | Status: DC
  Administered 2017-10-01: 1 via TOPICAL

## 2017-10-01 MED ORDER — SENNOSIDES-DOCUSATE SODIUM 8.6-50 MG PO TABS: 1.0000 | ORAL_TABLET | Freq: Every evening | ORAL | Status: DC | PRN

## 2017-10-01 MED ORDER — HYDROMORPHONE HCL 1 MG/ML IJ SOLN
0.2500 mg | INTRAMUSCULAR | Status: DC | PRN
  Administered 2017-10-01 (×4): 0.5 mg via INTRAVENOUS
  Filled 2017-10-01 (×2): qty 1

## 2017-10-01 MED ORDER — ASPIRIN EC 81 MG PO TBEC
81.0000 mg | DELAYED_RELEASE_TABLET | Freq: Every day | ORAL | Status: DC
Start: 2017-10-01 — End: 2017-10-02
  Administered 2017-10-01 – 2017-10-02 (×2): 81 mg via ORAL
  Filled 2017-10-01 (×2): qty 1

## 2017-10-01 MED ORDER — TRIAMTERENE-HCTZ 37.5-25 MG PO TABS
1.0000 | ORAL_TABLET | Freq: Every day | ORAL | Status: DC
  Administered 2017-10-01 – 2017-10-02 (×2): 1 via ORAL
  Filled 2017-10-01: qty 1

## 2017-10-01 MED ORDER — KETOROLAC TROMETHAMINE 30 MG/ML IJ SOLN
30.0000 mg | Freq: Once | INTRAMUSCULAR | Status: AC
  Administered 2017-10-01: 30 mg via INTRAVENOUS
  Filled 2017-10-01: qty 1

## 2017-10-01 MED ORDER — ONDANSETRON HCL 4 MG/2ML IJ SOLN
INTRAMUSCULAR | Status: AC
  Filled 2017-10-01: qty 2

## 2017-10-01 MED ORDER — LIDOCAINE HCL 1 % IJ SOLN
INTRAMUSCULAR | Status: DC | PRN
  Administered 2017-10-01: 25 mg via INTRADERMAL

## 2017-10-01 MED ORDER — MIDAZOLAM HCL 2 MG/2ML IJ SOLN
INTRAMUSCULAR | Status: AC
  Filled 2017-10-01: qty 2

## 2017-10-01 MED ORDER — PROMETHAZINE HCL 25 MG/ML IJ SOLN: 25.0000 mg | Freq: Four times a day (QID) | INTRAMUSCULAR | Status: DC | PRN

## 2017-10-01 MED ORDER — KCL IN DEXTROSE-NACL 20-5-0.45 MEQ/L-%-% IV SOLN
INTRAVENOUS | Status: DC
  Administered 2017-10-01 – 2017-10-02 (×2): via INTRAVENOUS

## 2017-10-01 MED ORDER — LEVOTHYROXINE SODIUM 75 MCG PO TABS
166.0000 ug | ORAL_TABLET | Freq: Every day | ORAL | Status: DC
  Administered 2017-10-02: 163 ug via ORAL
  Filled 2017-10-01: qty 1

## 2017-10-01 MED ORDER — DEXAMETHASONE SODIUM PHOSPHATE 4 MG/ML IJ SOLN
4.0000 mg | Freq: Once | INTRAMUSCULAR | Status: AC
  Administered 2017-10-01: 4 mg via INTRAVENOUS

## 2017-10-01 MED ORDER — PROPOFOL 10 MG/ML IV BOLUS
INTRAVENOUS | Status: AC
  Filled 2017-10-01: qty 20

## 2017-10-01 MED ORDER — BUPIVACAINE LIPOSOME 1.3 % IJ SUSP
20.0000 mL | Freq: Once | INTRAMUSCULAR | Status: DC
  Filled 2017-10-01: qty 20

## 2017-10-01 MED ORDER — BUPIVACAINE LIPOSOME 1.3 % IJ SUSP
INTRAMUSCULAR | Status: AC
  Filled 2017-10-01: qty 20

## 2017-10-01 MED ORDER — MUPIROCIN 2 % EX OINT
TOPICAL_OINTMENT | CUTANEOUS | Status: AC
  Filled 2017-10-01: qty 22

## 2017-10-01 MED ORDER — DEXAMETHASONE SODIUM PHOSPHATE 4 MG/ML IJ SOLN
INTRAMUSCULAR | Status: AC
  Filled 2017-10-01: qty 1

## 2017-10-01 MED ORDER — CYCLOBENZAPRINE HCL 10 MG PO TABS
5.0000 mg | ORAL_TABLET | Freq: Three times a day (TID) | ORAL | Status: DC | PRN
  Administered 2017-10-01: 5 mg via ORAL
  Filled 2017-10-01: qty 1

## 2017-10-01 MED ORDER — BUPIVACAINE LIPOSOME 1.3 % IJ SUSP
INTRAMUSCULAR | Status: DC | PRN
  Administered 2017-10-01: 20 mL

## 2017-10-01 MED ORDER — PROPOFOL 10 MG/ML IV BOLUS
INTRAVENOUS | Status: DC | PRN
  Administered 2017-10-01: 20 mg via INTRAVENOUS
  Administered 2017-10-01: 130 mg via INTRAVENOUS

## 2017-10-01 MED ORDER — BISACODYL 10 MG RE SUPP: 10.0000 mg | Freq: Every day | RECTAL | Status: DC | PRN

## 2017-10-01 MED ORDER — ONDANSETRON HCL 4 MG/2ML IJ SOLN
4.0000 mg | Freq: Once | INTRAMUSCULAR | Status: AC
  Administered 2017-10-01: 4 mg via INTRAVENOUS

## 2017-10-01 MED ORDER — HYDROMORPHONE HCL 1 MG/ML IJ SOLN
1.0000 mg | INTRAMUSCULAR | Status: DC | PRN
  Administered 2017-10-01: 1 mg via INTRAVENOUS
  Administered 2017-10-01 (×2): 2 mg via INTRAVENOUS
  Filled 2017-10-01 (×3): qty 2

## 2017-10-01 MED ORDER — SODIUM CHLORIDE 0.9 % IV SOLN
8.0000 mg | Freq: Four times a day (QID) | INTRAVENOUS | Status: DC | PRN
  Filled 2017-10-01: qty 4

## 2017-10-01 SURGICAL SUPPLY — 52 items
ADH SKN CLS APL DERMABOND .7 (GAUZE/BANDAGES/DRESSINGS) ×2
APPLIER CLIP 13 LRG OPEN (CLIP)
APR CLP LRG 13 20 CLIP (CLIP)
BAG HAMPER (MISCELLANEOUS) ×3 IMPLANT
BLADE SURG SZ10 CARB STEEL (BLADE) ×3 IMPLANT
CLIP APPLIE 13 LRG OPEN (CLIP) IMPLANT
CLOTH BEACON ORANGE TIMEOUT ST (SAFETY) ×3 IMPLANT
COVER LIGHT HANDLE STERIS (MISCELLANEOUS) ×6 IMPLANT
DERMABOND ADVANCED (GAUZE/BANDAGES/DRESSINGS) ×1
DERMABOND ADVANCED .7 DNX12 (GAUZE/BANDAGES/DRESSINGS) ×2 IMPLANT
DRAPE WARM FLUID 44X44 (DRAPE) ×3 IMPLANT
DRSG OPSITE POSTOP 4X8 (GAUZE/BANDAGES/DRESSINGS) ×3 IMPLANT
ELECT REM PT RETURN 9FT ADLT (ELECTROSURGICAL) ×3
ELECTRODE REM PT RTRN 9FT ADLT (ELECTROSURGICAL) ×2 IMPLANT
FORMALIN 10 PREFIL 480ML (MISCELLANEOUS) ×3 IMPLANT
GAUZE SPONGE 4X4 12PLY STRL (GAUZE/BANDAGES/DRESSINGS) ×3 IMPLANT
GLOVE BIOGEL PI IND STRL 6.5 (GLOVE) IMPLANT
GLOVE BIOGEL PI IND STRL 7.0 (GLOVE) ×2 IMPLANT
GLOVE BIOGEL PI IND STRL 8 (GLOVE) ×2 IMPLANT
GLOVE BIOGEL PI INDICATOR 6.5 (GLOVE) ×2
GLOVE BIOGEL PI INDICATOR 7.0 (GLOVE) ×2
GLOVE BIOGEL PI INDICATOR 8 (GLOVE) ×1
GLOVE ECLIPSE 7.0 STRL STRAW (GLOVE) ×2 IMPLANT
GLOVE ECLIPSE 8.0 STRL XLNG CF (GLOVE) ×3 IMPLANT
GOWN STRL REUS W/ TWL LRG LVL3 (GOWN DISPOSABLE) ×4 IMPLANT
GOWN STRL REUS W/TWL LRG LVL3 (GOWN DISPOSABLE) ×6
GOWN STRL REUS W/TWL XL LVL3 (GOWN DISPOSABLE) ×3 IMPLANT
HEMOSTAT ARISTA ABSORB 3G PWDR (MISCELLANEOUS) ×1 IMPLANT
INST SET MAJOR GENERAL (KITS) ×3 IMPLANT
KIT ROOM TURNOVER APOR (KITS) ×3 IMPLANT
MANIFOLD NEPTUNE II (INSTRUMENTS) ×3 IMPLANT
NDL HYPO 21X1.5 SAFETY (NEEDLE) ×2 IMPLANT
NEEDLE HYPO 21X1.5 SAFETY (NEEDLE) ×3 IMPLANT
NS IRRIG 1000ML POUR BTL (IV SOLUTION) ×6 IMPLANT
PACK ABDOMINAL MAJOR (CUSTOM PROCEDURE TRAY) ×3 IMPLANT
PAD ABD 5X9 TENDERSORB (GAUZE/BANDAGES/DRESSINGS) ×1 IMPLANT
PAD ARMBOARD 7.5X6 YLW CONV (MISCELLANEOUS) ×3 IMPLANT
SET BASIN LINEN APH (SET/KITS/TRAYS/PACK) ×3 IMPLANT
SUT CHROMIC 0 CT 1 (SUTURE) ×3 IMPLANT
SUT MNCRL+ AB 3-0 CT1 36 (SUTURE) IMPLANT
SUT MON AB 3-0 SH 27 (SUTURE) IMPLANT
SUT MONOCRYL AB 3-0 CT1 36IN (SUTURE)
SUT PLAIN 2 0 XLH (SUTURE) IMPLANT
SUT VIC AB 0 CT1 27 (SUTURE) ×12
SUT VIC AB 0 CT1 27XBRD ANTBC (SUTURE) ×2 IMPLANT
SUT VIC AB 0 CT1 27XCR 8 STRN (SUTURE) ×4 IMPLANT
SUT VIC AB 0 CTX 36 (SUTURE) ×3
SUT VIC AB 0 CTX36XBRD ANTBCTR (SUTURE) ×2 IMPLANT
SUT VICRYL 3 0 (SUTURE) IMPLANT
SYR 20CC LL (SYRINGE) ×3 IMPLANT
TAPE PAPER 3X10 WHT MICROPORE (GAUZE/BANDAGES/DRESSINGS) ×1 IMPLANT
TRAY FOLEY W/METER SILVER 16FR (SET/KITS/TRAYS/PACK) ×3 IMPLANT

## 2017-10-01 NOTE — H&P (Signed)
Preoperative History and Physical  Holly Hamilton is a 46 y.o. No obstetric history on file. with No LMP recorded (lmp unknown). admitted for a abdomina supracervical hysterectomy and removal of Fallopian tubes.  Pt has had a complicated course.  She is S/P an endometrial ablation 06/2017 for conservative management approach to her menometrorrhagia which previously had required transfusion.  After her ablation she once again had heavy prolonged vaginal bleeding and was started on megestrol and lysteda.  Unfortunately her bleeding continued and she was recently admitted and received an additional 4 units of PRBC which increased her hemoglobin to 9.8.  As a result she is admiotted for abdominal supracervical hysterectomy for definitive surgical management.  Her surgery was scheduled for last week but she was hypokalemic and was cancelled to this week.  PMH:    Past Medical History:  Diagnosis Date  . Anemia   . Complication of anesthesia   . Grave's disease   . Headache   . Hypertension   . Stroke ([place]) 2010   TIA x 3    PSH:     Past Surgical History:  Procedure Laterality Date  . DILITATION & CURRETTAGE/HYSTROSCOPY WITH NOVASURE ABLATION  06/27/2017   Procedure: DILATATION & CURETTAGE/HYSTEROSCOPY WITH NOVASURE ABLATION WITH PAP SMEAR;  Surgeon: Lavonia Drafts, MD;  Location: [facility];  Service: Gynecology;;  . ENDOMETRIAL ABLATION W/ NOVASURE  06/2017  . THYROIDECTOMY      POb/GynH:      OB History    No data available      SH:   Social History  Substance Use Topics  . Smoking status: Never Smoker  . Smokeless tobacco: Never Used  . Alcohol use No    FH:   History reviewed. No pertinent family history.   Allergies: No Known Allergies  Medications:       Current Facility-Administered Medications:  .  bupivacaine liposome (EXPAREL) 1.3 % injection 266 mg, 20 mL, Infiltration, Once, Eure, Luther H, MD .  ceFAZolin (ANCEF) IVPB 2g/100 mL premix, 2 g, Intravenous, On  Call to OR, Florian Buff, MD .  lactated ringers infusion, , Intravenous, Continuous, Lerry Liner, MD, Last Rate: 75 mL/hr at 10/01/17 0921 .  midazolam (VERSED) injection 1-2 mg, 1-2 mg, Intravenous, Q5 min, Lerry Liner, MD, 2 mg at 10/01/17 1610  Review of Systems:   Review of Systems  Constitutional: Negative for fever, chills, weight loss, malaise/fatigue and diaphoresis.  HENT: Negative for hearing loss, ear pain, nosebleeds, congestion, sore throat, neck pain, tinnitus and ear discharge.   Eyes: Negative for blurred vision, double vision, photophobia, pain, discharge and redness.  Respiratory: Negative for cough, hemoptysis, sputum production, shortness of breath, wheezing and stridor.   Cardiovascular: Negative for chest pain, palpitations, orthopnea, claudication, leg swelling and PND.  Gastrointestinal: Positive for abdominal pain. Negative for heartburn, nausea, vomiting, diarrhea, constipation, blood in stool and melena.  Genitourinary: Negative for dysuria, urgency, frequency, hematuria and flank pain.  Musculoskeletal: Negative for myalgias, back pain, joint pain and falls.  Skin: Negative for itching and rash.  Neurological: Negative for dizziness, tingling, tremors, sensory change, speech change, focal weakness, seizures, loss of consciousness, weakness and headaches.  Endo/Heme/Allergies: Negative for environmental allergies and polydipsia. Does not bruise/bleed easily.  Psychiatric/Behavioral: Negative for depression, suicidal ideas, hallucinations, memory loss and substance abuse. The patient is not nervous/anxious and does not have insomnia.      PHYSICAL EXAM:  Blood pressure 113/73, temperature 98 F (36.7 C), temperature source Oral, resp. rate 16,  SpO2 100 %.    Vitals reviewed. Constitutional: She is oriented to person, place, and time. She appears well-developed and well-nourished.  HENT:  Head: Normocephalic and atraumatic.  Right Ear: External ear  normal.  Left Ear: External ear normal.  Nose: Nose normal.  Mouth/Throat: Oropharynx is clear and moist.  Eyes: Conjunctivae and EOM are normal. Pupils are equal, round, and reactive to light. Right eye exhibits no discharge. Left eye exhibits no discharge. No scleral icterus.  Neck: Normal range of motion. Neck supple. No tracheal deviation present. No thyromegaly present.  Cardiovascular: Normal rate, regular rhythm, normal heart sounds and intact distal pulses.  Exam reveals no gallop and no friction rub.   No murmur heard. Respiratory: Effort normal and breath sounds normal. No respiratory distress. She has no wheezes. She has no rales. She exhibits no tenderness.  GI: Soft. Bowel sounds are normal. She exhibits no distension and no mass. There is tenderness. There is no rebound and no guarding.  Genitourinary:       Vulva is normal without lesions Vagina is pink moist without discharge Cervix normal in appearance and pap is normal Uterus is 16 weeks size with multiple fibroids Adnexa is negative with normal sized ovaries by sonogram  Musculoskeletal: Normal range of motion. She exhibits no edema and no tenderness.  Neurological: She is alert and oriented to person, place, and time. She has normal reflexes. She displays normal reflexes. No cranial nerve deficit. She exhibits normal muscle tone. Coordination normal.  Skin: Skin is warm and dry. No rash noted. No erythema. No pallor.  Psychiatric: She has a normal mood and affect. Her behavior is normal. Judgment and thought content normal.    Labs: Results for orders placed or performed during the hospital encounter of 10/01/17 (from the past 336 hour(s))  hCG, serum, qualitative   Collection Time: 10/01/17  7:58 AM  Result Value Ref Range   Preg, Serum NEGATIVE NEGATIVE  CBC   Collection Time: 10/01/17  7:58 AM  Result Value Ref Range   WBC 7.2 4.0 - 10.5 K/uL   RBC 3.82 (L) 3.87 - 5.11 MIL/uL   Hemoglobin 9.8 (L) 12.0 - 15.0  g/dL   HCT 33.2 (L) 36.0 - 46.0 %   MCV 86.9 78.0 - 100.0 fL   MCH 25.7 (L) 26.0 - 34.0 pg   MCHC 29.5 (L) 30.0 - 36.0 g/dL   RDW 20.4 (H) 11.5 - 15.5 %   Platelets 504 (H) 150 - 400 K/uL  Comprehensive metabolic panel   Collection Time: 10/01/17  7:58 AM  Result Value Ref Range   Sodium 139 135 - 145 mmol/L   Potassium 4.3 3.5 - 5.1 mmol/L   Chloride 109 101 - 111 mmol/L   CO2 22 22 - 32 mmol/L   Glucose, Bld 89 65 - 99 mg/dL   BUN 16 6 - 20 mg/dL   Creatinine, Ser 0.88 0.44 - 1.00 mg/dL   Calcium 9.1 8.9 - 10.3 mg/dL   Total Protein 7.9 6.5 - 8.1 g/dL   Albumin 3.6 3.5 - 5.0 g/dL   AST 31 15 - 41 U/L   ALT 26 14 - 54 U/L   Alkaline Phosphatase 54 38 - 126 U/L   Total Bilirubin 0.4 0.3 - 1.2 mg/dL   GFR calc non Af Amer >60 >60 mL/min   GFR calc Af Amer >60 >60 mL/min   Anion gap 8 5 - 15  Type and screen [facility]   Collection Time:  10/01/17  7:58 AM  Result Value Ref Range   ABO/RH(D) O POS    Antibody Screen NEG    Sample Expiration 10/15/2017    Extend sample reason NO TRANSFUSIONS OR PREGNANCY IN THE PAST 3 MONTHS   Results for orders placed or performed during the hospital encounter of 09/24/17 (from the past 336 hour(s))  I-STAT 4, (NA,K, GLUC, HGB,HCT)   Collection Time: 09/24/17 10:10 AM  Result Value Ref Range   Sodium 143 135 - 145 mmol/L   Potassium 2.9 (L) 3.5 - 5.1 mmol/L   Glucose, Bld 82 65 - 99 mg/dL   HCT 32.0 (L) 36.0 - 46.0 %   Hemoglobin 10.9 (L) 12.0 - 15.0 g/dL  Basic metabolic panel   Collection Time: 09/24/17 10:15 AM  Result Value Ref Range   Sodium 138 135 - 145 mmol/L   Potassium 2.8 (L) 3.5 - 5.1 mmol/L   Chloride 106 101 - 111 mmol/L   CO2 23 22 - 32 mmol/L   Glucose, Bld 81 65 - 99 mg/dL   BUN 14 6 - 20 mg/dL   Creatinine, Ser 0.84 0.44 - 1.00 mg/dL   Calcium 8.9 8.9 - 10.3 mg/dL   GFR calc non Af Amer >60 >60 mL/min   GFR calc Af Amer >60 >60 mL/min   Anion gap 9 5 - 15  Results for orders placed or performed during  the hospital encounter of 09/19/17 (from the past 336 hour(s))  CBC   Collection Time: 09/19/17  3:05 PM  Result Value Ref Range   WBC 9.6 4.0 - 10.5 K/uL   RBC 3.91 3.87 - 5.11 MIL/uL   Hemoglobin 9.9 (L) 12.0 - 15.0 g/dL   HCT 31.6 (L) 36.0 - 46.0 %   MCV 80.8 78.0 - 100.0 fL   MCH 25.3 (L) 26.0 - 34.0 pg   MCHC 31.3 30.0 - 36.0 g/dL   RDW 16.6 (H) 11.5 - 15.5 %   Platelets 338 150 - 400 K/uL  Comprehensive metabolic panel   Collection Time: 09/19/17  3:05 PM  Result Value Ref Range   Sodium 136 135 - 145 mmol/L   Potassium 3.3 (L) 3.5 - 5.1 mmol/L   Chloride 104 101 - 111 mmol/L   CO2 25 22 - 32 mmol/L   Glucose, Bld 72 65 - 99 mg/dL   BUN 8 6 - 20 mg/dL   Creatinine, Ser 0.82 0.44 - 1.00 mg/dL   Calcium 8.9 8.9 - 10.3 mg/dL   Total Protein 6.9 6.5 - 8.1 g/dL   Albumin 3.3 (L) 3.5 - 5.0 g/dL   AST 69 (H) 15 - 41 U/L   ALT 61 (H) 14 - 54 U/L   Alkaline Phosphatase 60 38 - 126 U/L   Total Bilirubin 0.6 0.3 - 1.2 mg/dL   GFR calc non Af Amer >60 >60 mL/min   GFR calc Af Amer >60 >60 mL/min   Anion gap 7 5 - 15  hCG, serum, qualitative   Collection Time: 09/19/17  3:05 PM  Result Value Ref Range   Preg, Serum NEGATIVE NEGATIVE  RPR   Collection Time: 09/19/17  3:05 PM  Result Value Ref Range   RPR Ser Ql Non Reactive Non Reactive  Urinalysis, Routine w reflex microscopic   Collection Time: 09/19/17  3:05 PM  Result Value Ref Range   Color, Urine AMBER (A) YELLOW   APPearance CLOUDY (A) CLEAR   Specific Gravity, Urine 1.030 1.005 - 1.030   pH 5.0 5.0 -  8.0   Glucose, UA NEGATIVE NEGATIVE mg/dL   Hgb urine dipstick LARGE (A) NEGATIVE   Bilirubin Urine NEGATIVE NEGATIVE   Ketones, ur NEGATIVE NEGATIVE mg/dL   Protein, ur 100 (A) NEGATIVE mg/dL   Nitrite NEGATIVE NEGATIVE   Leukocytes, UA MODERATE (A) NEGATIVE   RBC / HPF TOO NUMEROUS TO COUNT 0 - 5 RBC/hpf   WBC, UA 0-5 0 - 5 WBC/hpf   Bacteria, UA RARE (A) NONE SEEN   Squamous Epithelial / LPF NONE SEEN NONE  SEEN   Mucus PRESENT   Type and screen   Collection Time: 09/19/17  3:05 PM  Result Value Ref Range   ABO/RH(D) O POS    Antibody Screen NEG    Sample Expiration 10/03/2017    Extend sample reason NO TRANSFUSIONS OR PREGNANCY IN THE PAST 3 MONTHS    Unit Number [id number]    Blood Component Type RED CELLS,LR    Unit division 00    Status of Unit ALLOCATED    Transfusion Status OK TO TRANSFUSE    Crossmatch Result Compatible    Unit Number [id number]    Blood Component Type RED CELLS,LR    Unit division 00    Status of Unit ISS'D ANOTH PT    Transfusion Status OK TO TRANSFUSE    Crossmatch Result Compatible   ABO/Rh   Collection Time: 09/19/17  3:05 PM  Result Value Ref Range   ABO/RH(D) O POS   BPAM RBC   Collection Time: 09/19/17  3:05 PM  Result Value Ref Range   Blood Product Unit Number [id number]    Unit Type and Rh 5100    Blood Product Expiration Date 814481856314    ISSUE DATE / TIME 970263785885    Blood Product Unit Number [id number]    PRODUCT CODE H2094B09    Unit Type and Rh 5100    Blood Product Expiration Date 628366294765   Prepare RBC   Collection Time: 09/19/17  3:30 PM  Result Value Ref Range   Order Confirmation ORDER PROCESSED BY BLOOD BANK   Results for orders placed or performed during the hospital encounter of 09/17/17 (from the past 336 hour(s))  CBC   Collection Time: 09/17/17  9:50 AM  Result Value Ref Range   WBC 9.2 4.0 - 10.5 K/uL   RBC 2.56 (L) 3.87 - 5.11 MIL/uL   Hemoglobin 5.8 (LL) 12.0 - 15.0 g/dL   HCT 19.5 (L) 36.0 - 46.0 %   MCV 76.2 (L) 78.0 - 100.0 fL   MCH 22.7 (L) 26.0 - 34.0 pg   MCHC 29.7 (L) 30.0 - 36.0 g/dL   RDW 17.6 (H) 11.5 - 15.5 %   Platelets 373 150 - 400 K/uL  Comprehensive metabolic panel   Collection Time: 09/17/17  9:50 AM  Result Value Ref Range   Sodium 139 135 - 145 mmol/L   Potassium 3.0 (L) 3.5 - 5.1 mmol/L   Chloride 106 101 - 111 mmol/L   CO2 24 22 - 32 mmol/L   Glucose, Bld 98 65 -  99 mg/dL   BUN 16 6 - 20 mg/dL   Creatinine, Ser 0.77 0.44 - 1.00 mg/dL   Calcium 8.4 (L) 8.9 - 10.3 mg/dL   Total Protein 7.2 6.5 - 8.1 g/dL   Albumin 3.3 (L) 3.5 - 5.0 g/dL   AST 23 15 - 41 U/L   ALT 22 14 - 54 U/L   Alkaline Phosphatase 46 38 - 126 U/L   Total Bilirubin 0.1 (  L) 0.3 - 1.2 mg/dL   GFR calc non Af Amer >60 >60 mL/min   GFR calc Af Amer >60 >60 mL/min   Anion gap 9 5 - 15  Type and screen [facility]   Collection Time: 09/17/17  9:51 AM  Result Value Ref Range   ABO/RH(D) O POS    Antibody Screen NEG    Sample Expiration 09/20/2017    Unit Number [id number]    Blood Component Type RED CELLS,LR    Unit division 00    Status of Unit ISSUED,FINAL    Transfusion Status OK TO TRANSFUSE    Crossmatch Result Compatible    Unit Number [id number]    Blood Component Type RED CELLS,LR    Unit division 00    Status of Unit ISSUED,FINAL    Transfusion Status OK TO TRANSFUSE    Crossmatch Result Compatible    Unit Number [id number]    Blood Component Type RBC, LR IRR    Unit division 00    Status of Unit ISSUED,FINAL    Transfusion Status OK TO TRANSFUSE    Crossmatch Result Compatible    Unit Number [id number]    Blood Component Type RED CELLS,LR    Unit division 00    Status of Unit ISSUED,FINAL    Transfusion Status OK TO TRANSFUSE    Crossmatch Result Compatible   BPAM RBC   Collection Time: 09/17/17  9:51 AM  Result Value Ref Range   ISSUE DATE / TIME [id number]    Blood Product Unit Number [id number]    PRODUCT CODE E0336V00    Unit Type and Rh 5100    Blood Product Expiration Date 341937902409    ISSUE DATE / TIME 735329924268    Blood Product Unit Number [id number]    PRODUCT CODE E0336V00    Unit Type and Rh 5100    Blood Product Expiration Date 211941740814    ISSUE DATE / TIME [id number]    Blood Product Unit Number [id number]    PRODUCT CODE A1287O67    Unit Type and Rh 9500    Blood Product  Expiration Date 672094709628    ISSUE DATE / TIME 366294765465    Blood Product Unit Number [id number]    PRODUCT CODE E0336V00    Unit Type and Rh 5100    Blood Product Expiration Date 700174944967   Prepare RBC   Collection Time: 09/17/17 12:00 PM  Result Value Ref Range   Order Confirmation ORDER PROCESSED BY BLOOD BANK   Prepare RBC   Collection Time: 09/17/17  4:00 PM  Result Value Ref Range   Order Confirmation ORDER PROCESSED BY BLOOD BANK   CBC   Collection Time: 09/18/17  6:02 AM  Result Value Ref Range   WBC 11.6 (H) 4.0 - 10.5 K/uL   RBC 3.66 (L) 3.87 - 5.11 MIL/uL   Hemoglobin 9.5 (L) 12.0 - 15.0 g/dL   HCT 29.2 (L) 36.0 - 46.0 %   MCV 79.8 78.0 - 100.0 fL   MCH 26.0 26.0 - 34.0 pg   MCHC 32.5 30.0 - 36.0 g/dL   RDW 16.1 (H) 11.5 - 15.5 %   Platelets 300 150 - 400 K/uL    EKG: Orders placed or performed during the hospital encounter of 06/19/17  . EKG 12-Lead  . EKG 12-Lead  . EKG    Imaging Studies: No results found.    Assessment: 16 weeks size fibroid uterus Endometrial fibroid Menometrorrhagia S/P ablation requiring 4 units PRBC since  the ablation Failed conservative management with Lysteda and megestrol Resolved hypokalemia Patient Active Problem List   Diagnosis Date Noted  . Fe deficiency anemia 09/17/2017  . Symptomatic anemia 06/19/2017  . Abnormal uterine bleeding (AUB) 06/19/2017  . Fibroids 06/19/2017    Plan: Abdominal supracervical hysterectomy with removal of both Fallopian tubes She wants to preserve her ovaries and her cervix  Pt understands the risks of surgery including but not limited t  excessive bleeding requiring transfusion or reoperation, post-operative infection requiring prolonged hospitalization or re-hospitalization and antibiotic therapy, and damage to other organs including bladder, bowel, ureters and major vessels.  The patient also understands the alternative treatment options which were discussed in full.  All  questions were answered.  Florian Buff 10/01/2017 9:32 AM   EURE,LUTHER H 10/01/2017 9:31 AM

## 2017-10-01 NOTE — Op Note (Signed)
Preoperative diagnosis:  1.  16 week size fibroid uterus                                          2.  Menometrorrhagia                                         3.  Anemia requiring transfusion on 2 separate occasions                                         4.  Status post NovaSure endometrial ablation  Postoperative diagnosis:  Same as above   Procedure:  Abdominal hysterectomy, supracervical with removal of both fallopian tubes  Surgeon:  Florian Buff  Assistant:    Anesthesia:  General endotracheal  Preoperative clinical summary:  Holly Hamilton is a 46 y.o. No obstetric history on file. with No LMP recorded (lmp unknown). admitted for a abdomina supracervical hysterectomy and removal of Fallopian tubes.  Pt has had a complicated course.  She is S/P an endometrial ablation 06/2017 for conservative management approach to her menometrorrhagia which previously had required transfusion.  After her ablation she once again had heavy prolonged vaginal bleeding and was started on megestrol and lysteda.  Unfortunately her bleeding continued and she was recently admitted and received an additional 4 units of PRBC which increased her hemoglobin to 9.8.  As a result she is admiotted for abdominal supracervical hysterectomy for definitive surgical management.  Her surgery was scheduled for last week but she was hypokalemic and was cancelled to this week.  Intraoperative findings: Enlarged fibroid uterus with multiple fibroids present, uterus was very soft and boggy unusual for this age, large amount of blood in the endometrial cavity itself, both ovaries were normal, no other intraperitoneal abnormalities were noted  Description of operation:  Patient was taken to the operating room and placed in the supine position where she underwent general endotracheal anesthesia.  She was then prepped and draped in the usual sterile fashion and a Foley catheter was placed for continuous bladder drainage.  A Pfannenstiel  skin incision was made and carried down sharply to the rectus fascia which was scored in the midline and extended laterally.  The fascia was taken off the muscles superiorly and inferiorly without difficulty.  The muscles were divided.  The peritoneal cavity was entered.  The uterus was delivered through the abdominal wall incision.  Both uterine cornu were grasped with Coker clamps.  The left round ligament was suture ligated and coagulated with the electrocautery unit.  The left vesicouterine serosal flap was created.  An avascular window in in the peritoneum was created and the utero-ovarian ligament was cross clamped, cut and suture ligated.  The right round ligament was suture ligated and cut with the electrocautery unit.  The vesicouterine serosal flap on the right was created.  An avascular window in the peritoneum was created and the right utero-ovarian ligament was cross clamped, cut and double suture ligated.  Thus both ovaries were preserved.  The fallopian tubes were included in the specimen with the uterus and therefore removed as well. The uterine vessels were skeletonized bilaterally.  The uterine vessels were clamped bilaterally,  then  cut and suture ligated.  Two more pedicles were taken down the cervix medial to the uterine vessels.  Each pedicle was clamped cut and suture ligated with good resulting hemostasis.  As per the preoperative plan the cervix was then transected sharply and the specimen was removed.  The cervical stump was then closed anterior to posterior for hemostasis and reduce postoperative adhesions.  The pelvis was irrigated vigorously and all pedicles were examined and found to be hemostatic.  Arsita was placed in the pelvis to aid in hemostasis.  All specimens, uterus, fallopian tubes, cervical stump, were sent to pathology for routine evaluation.  The muscles and peritoneum were reapproximated loosely.  The fascia was closed with 0 Vicryl running.  20 cc of 1.3% exparel was  injected into the subcutaneous tissue.  The skin was closed using 3-0 Vicryl on a Keith needle in a subcuticular fashion.  Dermabond was then applied for additional wound integrity and to serve as a postoperative bacterial barrier.  The patient was awakened from anesthesia and taken to the recovery room in good stable condition. All sponge instrument and needle counts were correct x 3.  The patient received Ancef and Toradol prophylactically preoperatively.  Estimated blood loss for the procedure was 600  cc.  Holly Hamilton 10/01/2017 12:25 PM

## 2017-10-01 NOTE — Anesthesia Procedure Notes (Signed)
Procedure Name: Intubation Date/Time: 10/01/2017 10:28 AM Performed by: Charmaine Downs Pre-anesthesia Checklist: Patient identified, Patient being monitored, Timeout performed, Emergency Drugs available and Suction available Patient Re-evaluated:Patient Re-evaluated prior to induction Oxygen Delivery Method: Circle System Utilized Preoxygenation: Pre-oxygenation with 100% oxygen Induction Type: IV induction Ventilation: Mask ventilation without difficulty Laryngoscope Size: Mac and 4 Grade View: Grade II Tube type: Oral Tube size: 7.0 mm Number of attempts: 1 Airway Equipment and Method: Stylet Placement Confirmation: ETT inserted through vocal cords under direct vision,  positive ETCO2 and breath sounds checked- equal and bilateral Secured at: 22 cm Tube secured with: Tape Dental Injury: Teeth and Oropharynx as per pre-operative assessment

## 2017-10-01 NOTE — Anesthesia Preprocedure Evaluation (Signed)
Anesthesia Evaluation  Patient identified by MRN, date of birth, ID band Patient awake    History of Anesthesia Complications (+) history of anesthetic complications (Very SOB,( possible fluid overload?) after ablation procedure)  Airway Mallampati: II  TM Distance: >3 FB Neck ROM: Full    Dental no notable dental hx. (+) Poor Dentition, Missing, Chipped, Dental Advisory Given,    Pulmonary neg pulmonary ROS,    Pulmonary exam normal breath sounds clear to auscultation       Cardiovascular hypertension, Pt. on medications Normal cardiovascular exam Rhythm:Regular Rate:Normal     Neuro/Psych  Headaches, CVA negative neurological ROS  negative psych ROS   GI/Hepatic negative GI ROS, Neg liver ROS,   Endo/Other  Hyperthyroidism (Hx Grave's Disease, s/p resection )   Renal/GU negative Renal ROS     Musculoskeletal negative musculoskeletal ROS (+)   Abdominal Normal abdominal exam  (+)   Peds  Hematology  (+) Blood dyscrasia, anemia ,   Anesthesia Other Findings   Reproductive/Obstetrics                             Anesthesia Physical Anesthesia Plan  ASA: III  Anesthesia Plan: General   Post-op Pain Management:    Induction: Intravenous  PONV Risk Score and Plan:   Airway Management Planned: Oral ETT  Additional Equipment:   Intra-op Plan:   Post-operative Plan: Extubation in OR  Informed Consent: I have reviewed the patients History and Physical, chart, labs and discussed the procedure including the risks, benefits and alternatives for the proposed anesthesia with the patient or authorized representative who has indicated his/her understanding and acceptance.     Plan Discussed with:   Anesthesia Plan Comments:         Anesthesia Quick Evaluation

## 2017-10-01 NOTE — Transfer of Care (Signed)
Immediate Anesthesia Transfer of Care Note  Patient: [name]  Procedure(s) Performed: HYSTERECTOMY SUPRACERVICAL ABDOMINAL (N/A Abdomen) BILATERAL SALPINGECTOMY (Bilateral Abdomen)  Patient Location: PACU  Anesthesia Type:General  Level of Consciousness: awake, oriented and patient cooperative  Airway & Oxygen Therapy: Patient Spontanous Breathing and Patient connected to face mask oxygen  Post-op Assessment: Report given to RN and Post -op Vital signs reviewed and stable  Post vital signs: Reviewed and stable  Last Vitals:  Vitals:   10/01/17 0930 10/01/17 0945  BP: 106/72 102/67  Resp: (!) 43 16  Temp:    SpO2: 100% 100%    Last Pain:  Vitals:   10/01/17 0835  TempSrc: Oral      Patients Stated Pain Goal: 6 (59/74/71 8550)  Complications: No apparent anesthesia complications

## 2017-10-01 NOTE — Progress Notes (Signed)
PHARMACIST - PHYSICIAN ORDER COMMUNICATION  CONCERNING: P&T Medication Policy on Herbal Medications  DESCRIPTION:  This patient's order for:  cranberry  has been noted.  This product(s) is classified as an "herbal" or natural product. Due to a lack of definitive safety studies or FDA approval, nonstandard manufacturing practices, plus the potential risk of unknown drug-drug interactions while on inpatient medications, the Pharmacy and Therapeutics Committee does not permit the use of "herbal" or natural products of this type within [place].   ACTION TAKEN: The pharmacy department is unable to verify this order at this time  Please reevaluate patient's clinical condition at discharge and address if the herbal or natural product(s) should be resumed at that time.   

## 2017-10-01 NOTE — Anesthesia Postprocedure Evaluation (Signed)
Anesthesia Post Note  Patient: Holly Hamilton  Procedure(s) Performed: HYSTERECTOMY SUPRACERVICAL ABDOMINAL (N/A Abdomen) BILATERAL SALPINGECTOMY (Bilateral Abdomen)  Patient location during evaluation: PACU Anesthesia Type: General Level of consciousness: awake and alert, oriented and patient cooperative Pain management: pain level controlled Vital Signs Assessment: post-procedure vital signs reviewed and stable Respiratory status: spontaneous breathing, respiratory function stable and patient connected to face mask oxygen Cardiovascular status: stable Postop Assessment: no apparent nausea or vomiting Anesthetic complications: no     Last Vitals:  Vitals:   10/01/17 0945 10/01/17 1230  BP: 102/67 (!) (P) 159/82  Resp: 16 (P) 18  Temp:  36.8 C  SpO2: 100% (P) 100%    Last Pain:  Vitals:   10/01/17 0835  TempSrc: Oral                 Leshae Mcclay A

## 2017-10-02 ENCOUNTER — Encounter (HOSPITAL_COMMUNITY): Payer: Self-pay | Admitting: Obstetrics & Gynecology

## 2017-10-02 ENCOUNTER — Encounter: Payer: Self-pay | Admitting: Obstetrics & Gynecology

## 2017-10-02 DIAGNOSIS — D251 Intramural leiomyoma of uterus: Secondary | ICD-10-CM | POA: Diagnosis present

## 2017-10-02 DIAGNOSIS — D259 Leiomyoma of uterus, unspecified: Secondary | ICD-10-CM | POA: Diagnosis present

## 2017-10-02 DIAGNOSIS — I10 Essential (primary) hypertension: Secondary | ICD-10-CM | POA: Diagnosis present

## 2017-10-02 DIAGNOSIS — D252 Subserosal leiomyoma of uterus: Secondary | ICD-10-CM | POA: Diagnosis present

## 2017-10-02 DIAGNOSIS — D250 Submucous leiomyoma of uterus: Secondary | ICD-10-CM | POA: Diagnosis present

## 2017-10-02 DIAGNOSIS — N802 Endometriosis of fallopian tube: Secondary | ICD-10-CM | POA: Diagnosis present

## 2017-10-02 DIAGNOSIS — Z8673 Personal history of transient ischemic attack (TIA), and cerebral infarction without residual deficits: Secondary | ICD-10-CM | POA: Diagnosis not present

## 2017-10-02 DIAGNOSIS — Z9889 Other specified postprocedural states: Secondary | ICD-10-CM | POA: Diagnosis not present

## 2017-10-02 DIAGNOSIS — D509 Iron deficiency anemia, unspecified: Secondary | ICD-10-CM | POA: Diagnosis present

## 2017-10-02 DIAGNOSIS — N921 Excessive and frequent menstruation with irregular cycle: Secondary | ICD-10-CM | POA: Diagnosis present

## 2017-10-02 DIAGNOSIS — D62 Acute posthemorrhagic anemia: Secondary | ICD-10-CM | POA: Diagnosis present

## 2017-10-02 DIAGNOSIS — Z9089 Acquired absence of other organs: Secondary | ICD-10-CM | POA: Diagnosis not present

## 2017-10-02 LAB — BASIC METABOLIC PANEL
Anion gap: 7 (ref 5–15)
BUN: 13 mg/dL (ref 6–20)
CALCIUM: 8.2 mg/dL — AB (ref 8.9–10.3)
CHLORIDE: 106 mmol/L (ref 101–111)
CO2: 21 mmol/L — ABNORMAL LOW (ref 22–32)
CREATININE: 0.77 mg/dL (ref 0.44–1.00)
Glucose, Bld: 123 mg/dL — ABNORMAL HIGH (ref 65–99)
Potassium: 3.9 mmol/L (ref 3.5–5.1)
SODIUM: 134 mmol/L — AB (ref 135–145)

## 2017-10-02 LAB — CBC
HCT: 25.2 % — ABNORMAL LOW (ref 36.0–46.0)
Hemoglobin: 7.7 g/dL — ABNORMAL LOW (ref 12.0–15.0)
MCH: 26.1 pg (ref 26.0–34.0)
MCHC: 30.6 g/dL (ref 30.0–36.0)
MCV: 85.4 fL (ref 78.0–100.0)
PLATELETS: 431 10*3/uL — AB (ref 150–400)
RBC: 2.95 MIL/uL — ABNORMAL LOW (ref 3.87–5.11)
RDW: 20.1 % — AB (ref 11.5–15.5)
WBC: 11.6 10*3/uL — ABNORMAL HIGH (ref 4.0–10.5)

## 2017-10-02 MED ORDER — DOCUSATE SODIUM 100 MG PO CAPS: 100.0000 mg | ORAL_CAPSULE | Freq: Two times a day (BID) | ORAL | 0 refills | Status: DC

## 2017-10-02 MED ORDER — ONDANSETRON HCL 8 MG PO TABS: 8.0000 mg | ORAL_TABLET | Freq: Four times a day (QID) | ORAL | 0 refills | Status: AC | PRN

## 2017-10-02 MED ORDER — SILVER SULFADIAZINE 1 % EX CREA: TOPICAL_CREAM | CUTANEOUS | 0 refills | Status: AC

## 2017-10-02 MED ORDER — OXYCODONE-ACETAMINOPHEN 5-325 MG PO TABS: 1.0000 | ORAL_TABLET | ORAL | 0 refills | Status: DC | PRN

## 2017-10-02 NOTE — Plan of Care (Signed)
Problem: Tissue Perfusion: Goal: Risk factors for ineffective tissue perfusion will decrease Outcome: Progressing Pt wearing scds for dvt prevention   

## 2017-10-02 NOTE — Discharge Summary (Signed)
Physician Discharge Summary  Patient ID: Holly Hamilton MRN: [id number] DOB/AGE: 09/16/1971 46 y.o.  Admit date: 10/01/2017 Discharge date: 10/02/2017  Admission Diagnoses: 16 week fibroid uterus, menometrorrhagia Discharge Diagnoses:  Active Problems:   S/P hysterectomy   Discharged Condition: good  Hospital Course: unremarkable post operative course  Consults: None  Significant Diagnostic Studies: labs:   Treatments: surgery: Abdominal supracervical hysterectomy with removal of both Fallopian tubes  Discharge Exam: Blood pressure (!) 106/58, pulse 72, temperature 98.4 F (36.9 C), temperature source Oral, resp. rate 18, height 5\' 5"  (1.651 m), weight 177 lb 0.5 oz (80.3 kg), SpO2 100 %. General appearance: alert, cooperative and no distress GI: soft, non-tender; bowel sounds normal; no masses,  no organomegaly Incision/Wound:clean dry intact  Disposition: 01-Home or Self Care  Discharge Instructions    Call MD for:  persistant nausea and vomiting    Complete by:  As directed    Call MD for:  severe uncontrolled pain    Complete by:  As directed    Call MD for:  temperature >100.4    Complete by:  As directed    Diet - low sodium heart healthy    Complete by:  As directed    Driving Restrictions    Complete by:  As directed    No driving for 1 week   Increase activity slowly    Complete by:  As directed    Leave dressing on - Keep it clean, dry, and intact until clinic visit    Complete by:  As directed    Lifting restrictions    Complete by:  As directed    Do not lift more than 10 pounds   Sexual Activity Restrictions    Complete by:  As directed    You gotta be kidding?     Allergies as of 10/02/2017   No Known Allergies     Medication List    STOP taking these medications   INTEGRA F 125-1 MG Caps   megestrol 40 MG tablet Commonly known as:  MEGACE   potassium chloride SA 20 MEQ tablet Commonly known as:  K-DUR,KLOR-CON     TAKE these  medications   aspirin EC 81 MG tablet Take 81 mg by mouth daily.   Cranberry 500 MG Caps Take 500 mg by mouth daily.   cyclobenzaprine 5 MG tablet Commonly known as:  FLEXERIL Take 5 mg by mouth 3 (three) times daily as needed for muscle spasms.   docusate sodium 100 MG capsule Commonly known as:  COLACE Take 1 capsule (100 mg total) by mouth 2 (two) times daily.   levothyroxine 112 MCG tablet Commonly known as:  SYNTHROID, LEVOTHROID Take 1.5 tablets (168 mcg total) by mouth daily before breakfast.   ondansetron 8 MG tablet Commonly known as:  ZOFRAN Take 1 tablet (8 mg total) by mouth every 6 (six) hours as needed for nausea.   oxyCODONE-acetaminophen 5-325 MG tablet Commonly known as:  PERCOCET/ROXICET Take 1-2 tablets by mouth every 6 (six) hours as needed for moderate pain or severe pain. What changed:  Another medication with the same name was added. Make sure you understand how and when to take each.   oxyCODONE-acetaminophen 5-325 MG tablet Commonly known as:  PERCOCET/ROXICET Take 1-2 tablets by mouth every 4 (four) hours as needed (moderate to severe pain (when tolerating fluids)). What changed:  You were already taking a medication with the same name, and this prescription was added. Make sure you understand how and when  to take each.   silver sulfADIAZINE 1 % cream Commonly known as:  SILVADENE Apply to area on hand 3-4 times daily   triamterene-hydrochlorothiazide 37.5-25 MG capsule Commonly known as:  DYAZIDE Take 1 each (1 capsule total) by mouth daily.      Follow-up Information    Florian Buff, MD Follow up on 10/09/2017.   Specialties:  Obstetrics and Gynecology, Radiology Why:  post op visit Contact information: [place] [address] [phone]           Signed: Florian Buff 10/02/2017, 1:34 PM

## 2017-10-02 NOTE — Progress Notes (Signed)
Orders received for discharge. Instructions, medications and follow up appointments discussed with pt and husband. All questions answered and understanding verbalized. Prescriptions given to pt. IV removed. Pt taken out to car via [place] by RN with all belongings.

## 2017-10-02 NOTE — Addendum Note (Signed)
Addendum  created 10/02/17 1736 by Charmaine Downs, CRNA   Sign clinical note

## 2017-10-02 NOTE — Discharge Instructions (Signed)
Abdominal Hysterectomy, Care After °This sheet gives you information about how to care for yourself after your procedure. Your health care provider may also give you more specific instructions. If you have problems or questions, contact your health care provider. °What can I expect after the procedure? °After your procedure, it is common to have: °· Pain. °· Fatigue. °· Poor appetite. °· Less interest in sex. °· Vaginal bleeding and discharge. You may need to use a sanitary napkin after this procedure. ° °Follow these instructions at home: °Bathing °· Do not take baths, swim, or use a hot tub until your health care provider approves. Ask your health care provider if you can take showers. You may only be allowed to take sponge baths for bathing. °· Keep the bandage (dressing) dry until your health care provider says it can be removed. °Incision care °· Follow instructions from your health care provider about how to take care of your incision. Make sure you: °? Wash your hands with soap and water before you change your bandage (dressing). If soap and water are not available, use hand sanitizer. °? Change your dressing as told by your health care provider. °? Leave stitches (sutures), skin glue, or adhesive strips in place. These skin closures may need to stay in place for 2 weeks or longer. If adhesive strip edges start to loosen and curl up, you may trim the loose edges. Do not remove adhesive strips completely unless your health care provider tells you to do that. °· Check your incision area every day for signs of infection. Check for: °? Redness, swelling, or pain. °? Fluid or blood. °? Warmth. °? Pus or a bad smell. °Activity °· Do gentle, daily exercises as told by your health care provider. You may be told to take short walks every day and go farther each time. °· Do not lift anything that is heavier than 10 lb (4.5 kg), or the limit that your health care provider tells you, until he or she says that it is  safe. °· Do not drive or use heavy machinery while taking prescription pain medicine. °· Do not drive for 24 hours if you were given a medicine to help you relax (sedative). °· Follow your health care provider's instructions about exercise, driving, and general activities. Ask your health care provider what activities are safe for you. °Lifestyle °· Do not douche, use tampons, or have sex for at least 6 weeks or as told by your health care provider. °· Do not drink alcohol until your health care provider approves. °· Drink enough fluid to keep your urine clear or pale yellow. °· Try to have someone at home with you for the first 1-2 weeks to help. °· Do not use any products that contain nicotine or tobacco, such as cigarettes and e-cigarettes. These can delay healing. If you need help quitting, ask your health care provider. °General instructions °· Take over-the-counter and prescription medicines only as told by your health care provider. °· Do not take aspirin or ibuprofen. These medicines can cause bleeding. °· To prevent or treat constipation while you are taking prescription pain medicine, your health care provider may recommend that you: °? Drink enough fluid to keep your urine clear or pale yellow. °? Take over-the-counter or prescription medicines. °? Eat foods that are high in fiber, such as fresh fruits and vegetables, whole grains, and beans. °? Limit foods that are high in fat and processed sugars, such as fried and sweet foods. °· Keep all   follow-up visits as told by your health care provider. This is important. °Contact a health care provider if: °· You have chills or fever. °· You have redness, swelling, or pain around your incision. °· You have fluid or blood coming from your incision. °· Your incision feels warm to the touch. °· You have pus or a bad smell coming from your incision. °· Your incision breaks open. °· You feel dizzy or light-headed. °· You have pain or bleeding when you urinate. °· You  have persistent diarrhea. °· You have persistent nausea and vomiting. °· You have abnormal vaginal discharge. °· You have a rash. °· You have any type of abnormal reaction or you develop an allergy to your medicine. °· Your pain medicine does not help. °Get help right away if: °· You have a fever and your symptoms suddenly get worse. °· You have severe abdominal pain. °· You have shortness of breath. °· You faint. °· You have pain, swelling, or redness in your leg. °· You have heavy vaginal bleeding with blood clots. °Summary °· After your procedure, it is common to have pain, fatigue and vaginal discharge. °· Do not take baths, swim, or use a hot tub until your health care provider approves. Ask your health care provider if you can take showers. You may only be allowed to take sponge baths for bathing. °· Follow your health care provider's instructions about exercise, driving, and general activities. Ask your health care provider what activities are safe for you. °· Do not lift anything that is heavier than 10 lb (4.5 kg), or the limit that your health care provider tells you, until he or she says that it is safe. °· Try to have someone at home with you for the first 1-2 weeks to help. °This information is not intended to replace advice given to you by your health care provider. Make sure you discuss any questions you have with your health care provider. °Document Released: 06/28/2005 Document Revised: 11/27/2016 Document Reviewed: 11/27/2016 °Elsevier Interactive Patient Education © 2017 [employer] ° °

## 2017-10-02 NOTE — Anesthesia Postprocedure Evaluation (Signed)
Anesthesia Post Note  Patient: Holly Hamilton  Procedure(s) Performed: HYSTERECTOMY SUPRACERVICAL ABDOMINAL (N/A Abdomen) BILATERAL SALPINGECTOMY (Bilateral Abdomen)  Patient location during evaluation: Nursing Unit Anesthesia Type: General Level of consciousness: awake Pain management: pain level controlled Vital Signs Assessment: post-procedure vital signs reviewed and stable Respiratory status: spontaneous breathing, nonlabored ventilation and respiratory function stable Cardiovascular status: blood pressure returned to baseline Postop Assessment: no apparent nausea or vomiting Anesthetic complications: no     Last Vitals:  Vitals:   10/02/17 0543 10/02/17 1425  BP: (!) 106/58 111/62  Pulse: 72 81  Resp: 18 18  Temp: 36.9 C 36.8 C  SpO2: 100% 100%    Last Pain:  Vitals:   10/02/17 0936  TempSrc:   PainSc: 5                  Holly Hamilton

## 2017-10-03 LAB — TYPE AND SCREEN
ABO/RH(D): O POS
Antibody Screen: NEGATIVE
Unit division: 0
Unit division: 0

## 2017-10-03 LAB — BPAM RBC
Blood Product Expiration Date: 201810152359
Blood Product Expiration Date: 201810212359
UNIT TYPE AND RH: 5100
UNIT TYPE AND RH: 5100

## 2017-10-09 ENCOUNTER — Encounter: Payer: Self-pay | Admitting: Obstetrics & Gynecology

## 2017-10-09 ENCOUNTER — Ambulatory Visit (INDEPENDENT_AMBULATORY_CARE_PROVIDER_SITE_OTHER): Payer: 59 | Admitting: Obstetrics & Gynecology

## 2017-10-09 VITALS — BP 100/80 | HR 80 | Wt 174.0 lb

## 2017-10-09 DIAGNOSIS — Z90710 Acquired absence of both cervix and uterus: Secondary | ICD-10-CM

## 2017-10-09 DIAGNOSIS — Z9889 Other specified postprocedural states: Secondary | ICD-10-CM

## 2017-10-09 NOTE — Progress Notes (Signed)
  HPI: Patient returns for routine postoperative follow-up having undergone Abdominal supracervical hysterectomy with removal of Fallopian tubes on 10/02/2017.  The patient's immediate postoperative recovery has been unremarkable. Since hospital discharge the patient reports doing well normal bowel and bladder function.   Current Outpatient Prescriptions: aspirin EC 81 MG tablet, Take 81 mg by mouth daily., Disp: , Rfl:  Cranberry 500 MG CAPS, Take 500 mg by mouth daily. , Disp: , Rfl:  cyclobenzaprine (FLEXERIL) 5 MG tablet, Take 5 mg by mouth 3 (three) times daily as needed for muscle spasms. , Disp: , Rfl:  levothyroxine (SYNTHROID, LEVOTHROID) 112 MCG tablet, Take 112 mcg by mouth daily before breakfast., Disp: , Rfl:  ondansetron (ZOFRAN) 8 MG tablet, Take 1 tablet (8 mg total) by mouth every 6 (six) hours as needed for nausea., Disp: 20 tablet, Rfl: 0 oxyCODONE-acetaminophen (PERCOCET/ROXICET) 5-325 MG tablet, Take 1-2 tablets by mouth every 4 (four) hours as needed (moderate to severe pain (when tolerating fluids))., Disp: 30 tablet, Rfl: 0 triamterene-hydrochlorothiazide (DYAZIDE) 37.5-25 MG capsule, Take 1 each (1 capsule total) by mouth daily., Disp: 30 capsule, Rfl: 11 docusate sodium (COLACE) 100 MG capsule, Take 1 capsule (100 mg total) by mouth 2 (two) times daily. (Patient not taking: Reported on 10/09/2017), Disp: 30 capsule, Rfl: 0 levothyroxine (SYNTHROID, LEVOTHROID) 112 MCG tablet, Take 1.5 tablets (168 mcg total) by mouth daily before breakfast., Disp: 45 tablet, Rfl: 1 oxyCODONE-acetaminophen (PERCOCET/ROXICET) 5-325 MG tablet, Take 1-2 tablets by mouth every 6 (six) hours as needed for moderate pain or severe pain. (Patient not taking: Reported on 10/09/2017), Disp: 20 tablet, Rfl: 0 silver sulfADIAZINE (SILVADENE) 1 % cream, Apply to area on hand 3-4 times daily (Patient not taking: Reported on 10/09/2017), Disp: 50 g, Rfl: 0  No current facility-administered medications  for this visit.     Blood pressure 100/80, pulse 80, weight 174 lb (78.9 kg).  Physical Exam: Incision clean dry intact Abdomen normal post op visit  Diagnostic Tests:   Pathology: benign  Impression: S/p Abdominal hysterectomy  Plan:   Follow up: 4  weeks  Florian Buff, MD

## 2017-10-16 ENCOUNTER — Telehealth: Payer: Self-pay | Admitting: Obstetrics & Gynecology

## 2017-10-17 ENCOUNTER — Encounter: Payer: Self-pay | Admitting: Obstetrics & Gynecology

## 2017-10-17 NOTE — Telephone Encounter (Signed)
Tish, letter has been written will leave at front or mailed if desired

## 2017-10-17 NOTE — Telephone Encounter (Signed)
Notified patient of note. Will mail per patient request.

## 2017-10-22 ENCOUNTER — Other Ambulatory Visit: Payer: Self-pay | Admitting: Medical

## 2017-10-22 DIAGNOSIS — N939 Abnormal uterine and vaginal bleeding, unspecified: Secondary | ICD-10-CM

## 2017-11-04 ENCOUNTER — Ambulatory Visit (HOSPITAL_COMMUNITY): Admit: 2017-11-04 | Payer: 59 | Admitting: Obstetrics & Gynecology

## 2017-11-04 ENCOUNTER — Encounter (HOSPITAL_COMMUNITY): Payer: Self-pay

## 2017-11-04 SURGERY — ROBOTIC ASSISTED TOTAL HYSTERECTOMY WITH SALPINGECTOMY
Anesthesia: Choice | Laterality: Bilateral

## 2017-11-06 ENCOUNTER — Encounter: Payer: Self-pay | Admitting: Obstetrics & Gynecology

## 2017-11-06 ENCOUNTER — Ambulatory Visit (INDEPENDENT_AMBULATORY_CARE_PROVIDER_SITE_OTHER): Payer: 59 | Admitting: Obstetrics & Gynecology

## 2017-11-06 VITALS — BP 136/78 | HR 80 | Ht 64.0 in | Wt 186.0 lb

## 2017-11-06 DIAGNOSIS — Z9889 Other specified postprocedural states: Secondary | ICD-10-CM

## 2017-11-06 DIAGNOSIS — Z90710 Acquired absence of both cervix and uterus: Secondary | ICD-10-CM

## 2017-11-19 ENCOUNTER — Encounter: Payer: Self-pay | Admitting: Obstetrics & Gynecology

## 2017-11-19 NOTE — Progress Notes (Signed)
  HPI: Patient returns for routine postoperative follow-up having undergone supracervical abdominal hysterectomy with removal of both fallopian tubes on 10/01/2017.  The patient's immediate postoperative recovery has been unremarkable. Since hospital discharge the patient reports no problems doing well.   Current Outpatient Medications: aspirin EC 81 MG tablet, Take 81 mg by mouth daily., Disp: , Rfl:  Cranberry 500 MG CAPS, Take 500 mg by mouth daily. , Disp: , Rfl:  cyclobenzaprine (FLEXERIL) 5 MG tablet, Take 5 mg by mouth 3 (three) times daily as needed for muscle spasms. , Disp: , Rfl:  levothyroxine (SYNTHROID, LEVOTHROID) 112 MCG tablet, TAKE 1 & 1/2 (ONE & ONE-HALF) TABLETS BY MOUTH ONCE DAILY BEFORE  BREAKFAST., Disp: 45 tablet, Rfl: 0 ondansetron (ZOFRAN) 8 MG tablet, Take 1 tablet (8 mg total) by mouth every 6 (six) hours as needed for nausea., Disp: 20 tablet, Rfl: 0 silver sulfADIAZINE (SILVADENE) 1 % cream, Apply to area on hand 3-4 times daily, Disp: 50 g, Rfl: 0 triamterene-hydrochlorothiazide (DYAZIDE) 37.5-25 MG capsule, Take 1 each (1 capsule total) by mouth daily., Disp: 30 capsule, Rfl: 11  No current facility-administered medications for this visit.     Blood pressure 136/78, pulse 80, height 5\' 4"  (1.626 m), weight 186 lb (84.4 kg).  Physical Exam: Incision is clean dry and intact Abdominal exam is benign Vaginal exam is normal Cervix is benign Bimanual is normal  Diagnostic Tests:   Pathology: Benign  Impression: Status post abdominal hysterectomy supracervical removal of below fallopian tubes With normal postoperative course Plan: No further follow-up needed  Follow up: 1  years for GYN visit or as needed  Florian Buff, MD

## 2019-03-18 IMAGING — US US TRANSVAGINAL NON-OB
1 series · 15 of 25 positions shown · non-contrast
Comparison: None

CLINICAL DATA: 46-year-old female with abnormal uterine bleeding
for 3 weeks and lower abdominal pain. Anemia. LMP 05/22/2017.

EXAM:
TRANSABDOMINAL AND TRANSVAGINAL ULTRASOUND OF PELVIS
TECHNIQUE: Both transabdominal and transvaginal ultrasound examinations of the
pelvis were performed. Transabdominal technique was performed for
global imaging of the pelvis including uterus, ovaries, adnexal
regions, and pelvic cul-de-sac. It was necessary to proceed with
endovaginal exam following the transabdominal exam to visualize the
endometrium, myometrium and adnexa.

[Series 1: us transvaginal non-ob · 15 of 69 slices shown]
[im 1/69]
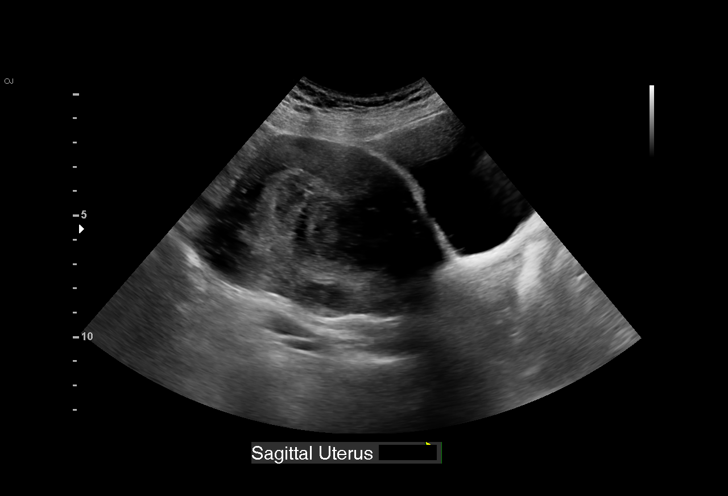
[im 6/69]
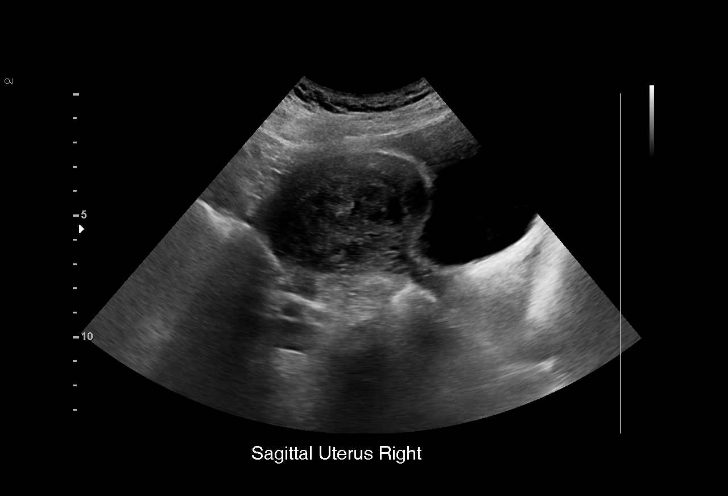
[im 12/69]
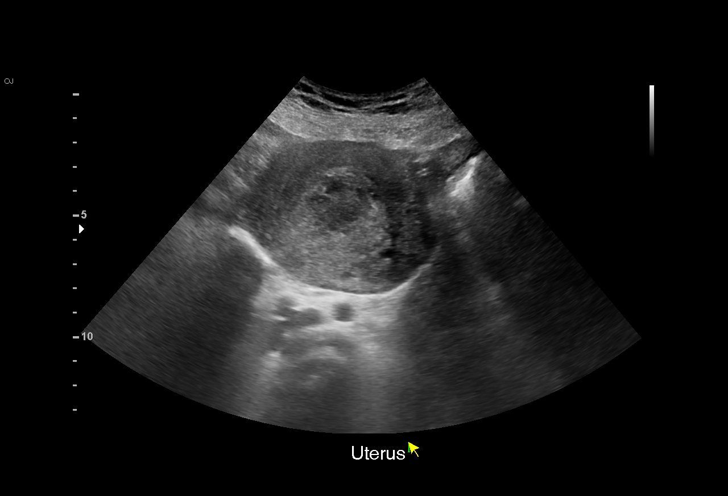
[im 15/69]
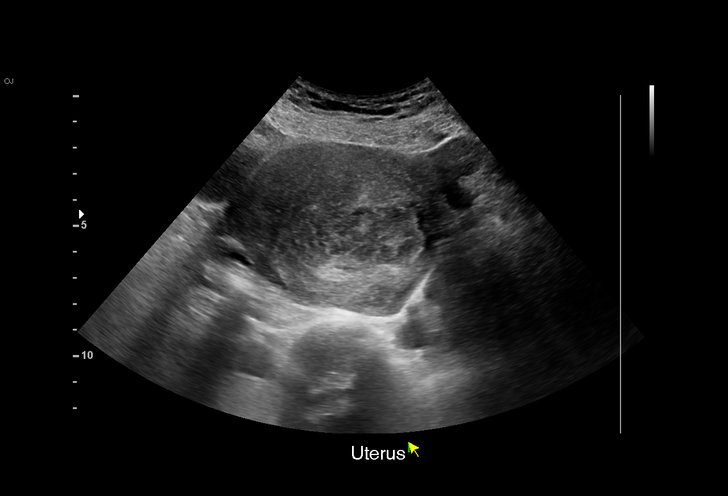
[im 20/69]
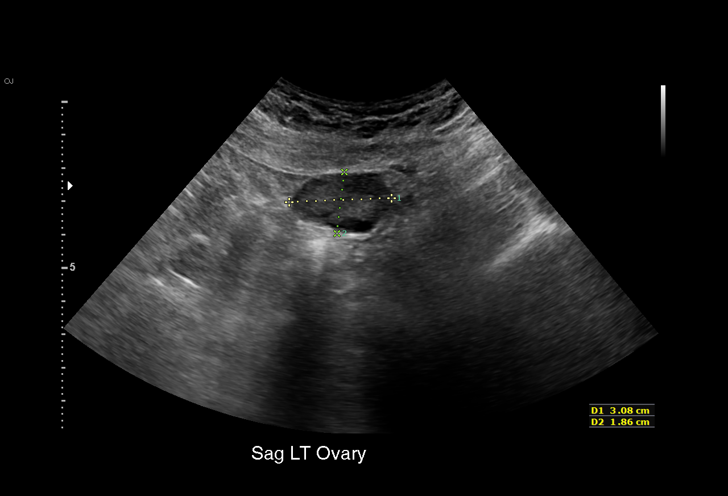
[im 26/69]
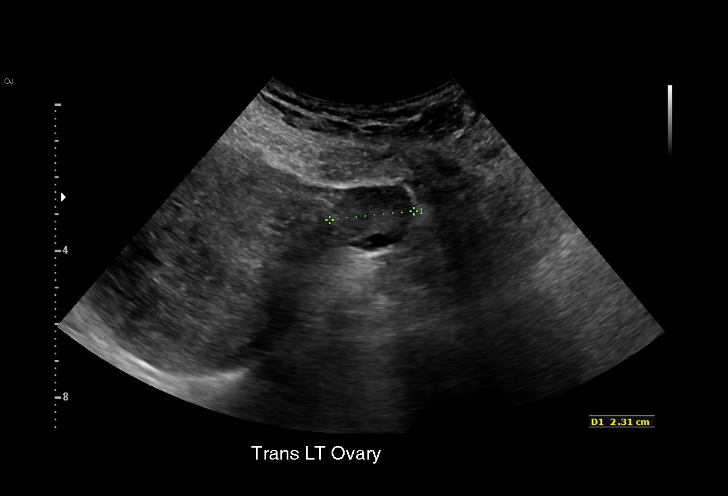
[im 29/69]
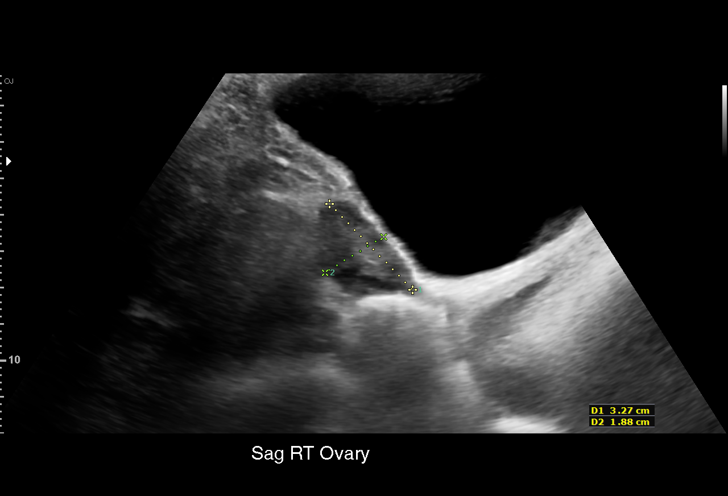
[im 35/69]
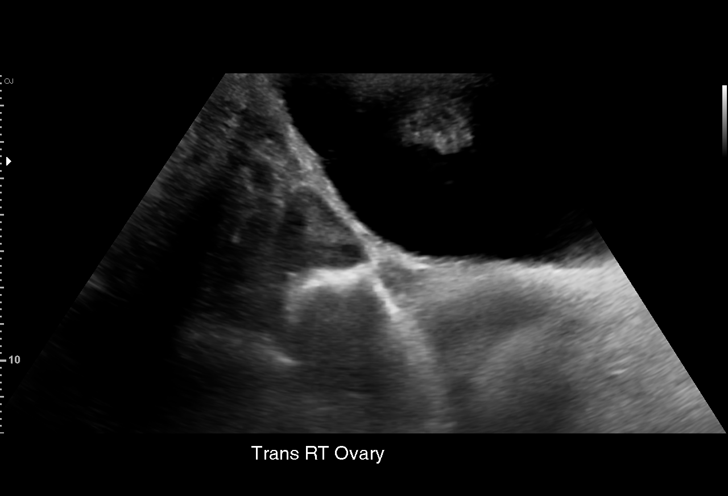
[im 40/69]
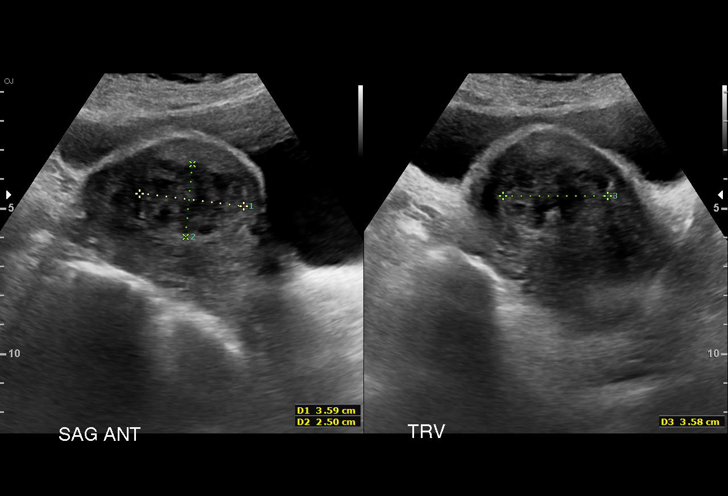
[im 43/69]
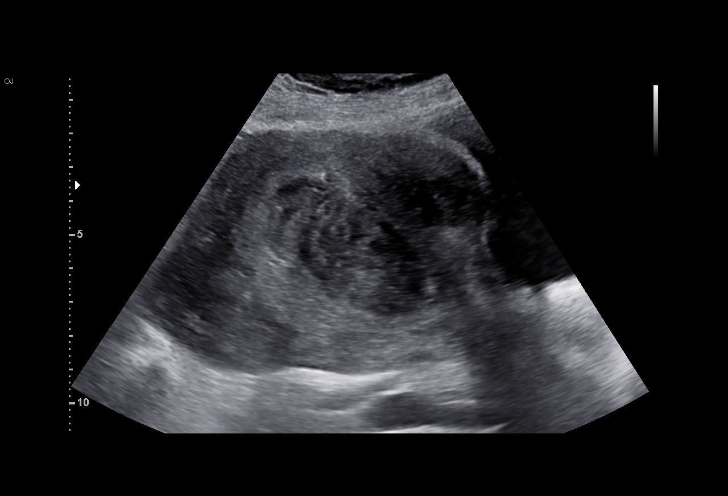
[im 49/69]
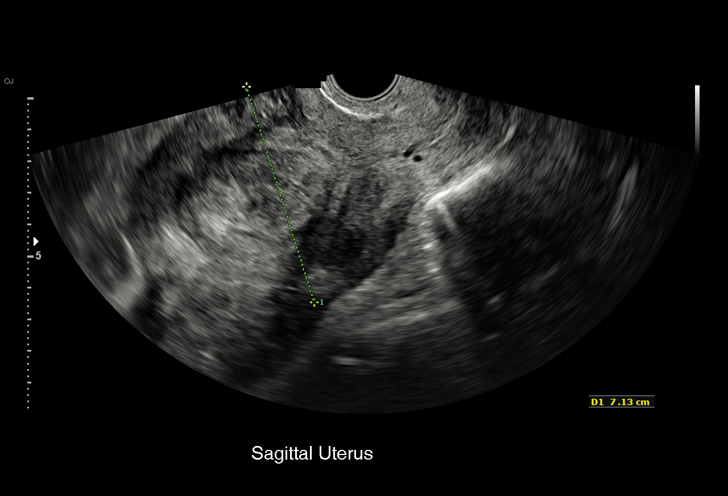
[im 54/69]
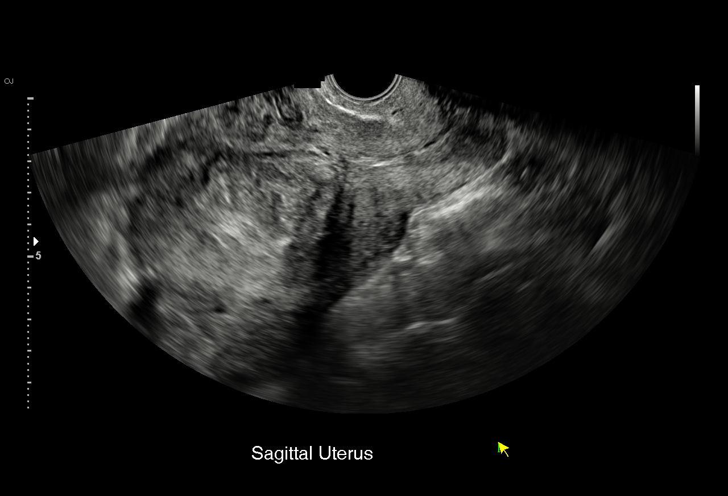
[im 57/69]
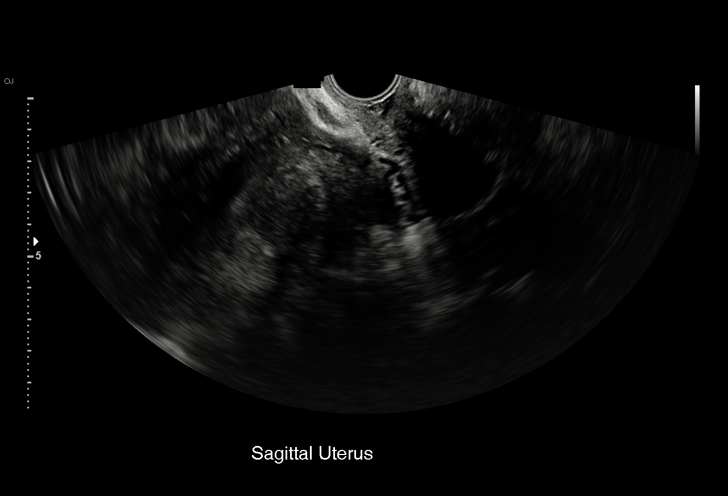
[im 63/69]
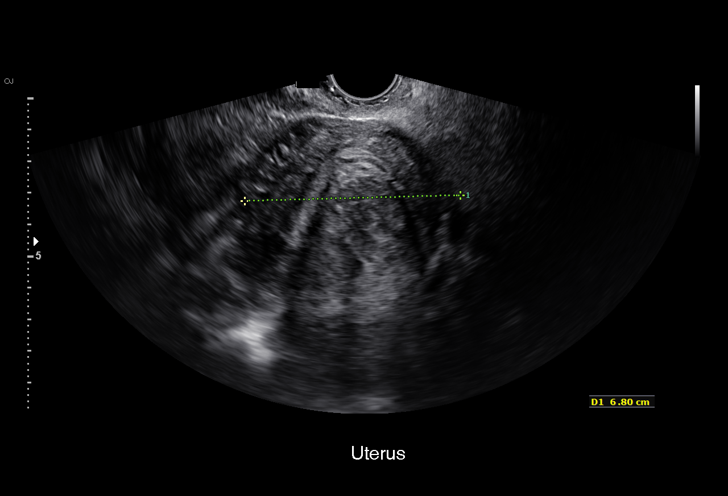
[im 69/69]
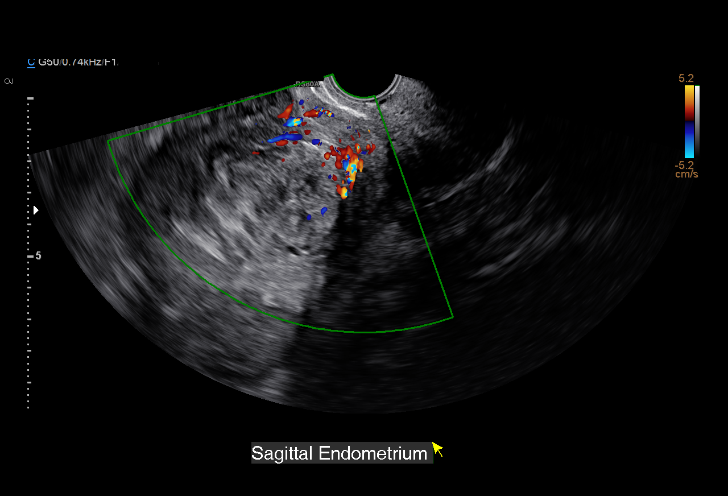

[15 of 25 positions shown; findings below may reference images not displayed]

FINDINGS: Uterus

Measurements: 12.5 x 7.0 x 7.7 cm. Enlarged anteverted myomatous
uterus, with representative fibroids as follows:

- left posterior fundal 4.8 x 4.1 x 4.6 cm subserosal fibroid

- right anterior uterine body intramural 3.6 x 2.5 x 3.6 cm fibroid

- posterior right uterine body intramural 1.5 x 1.2 x 1.6 cm fibroid

- right anterior uterine body intramural 2.1 x 2.1 x 1.7 cm fibroid

Endometrium

There is a large 4.6 x 3.6 x 3.9 cm mass distending the endometrial
cavity, which demonstrates heterogeneous echogenicity and internal
vascularity on color Doppler.

Right ovary

Measurements: 3.3 x 1.9 x 2.0 cm. Normal appearance/no adnexal mass.

Left ovary

Measurements: 3.1 x 1.9 x 2.3 cm. Normal appearance/no adnexal mass.

Other findings

No abnormal free fluid.
IMPRESSION: 1. Enlarged anteverted myomatous uterus as detailed .
2. Large 4.6 x 3.6 x 3.9 cm heterogeneous solid mass distending the
endometrial cavity, most likely to represent an intracavitary
fibroid. Endometrial malignancy is less likely but not excluded.
Consider hysteroscopic evaluation with directed tissue sampling.
Saline infusion hysterosonography could be obtained for further
characterization, as clinically warranted.
3. Normal ovaries.  No adnexal masses.

## 2020-09-13 ENCOUNTER — Encounter (HOSPITAL_COMMUNITY): Payer: Self-pay | Admitting: Emergency Medicine

## 2020-09-13 ENCOUNTER — Emergency Department (HOSPITAL_COMMUNITY)
Admission: EM | Admit: 2020-09-13 | Discharge: 2020-09-13 | Disposition: A | Discharge status: Home / Self Care | Payer: Managed Care, Other (non HMO) | Attending: Emergency Medicine | Admitting: Emergency Medicine

## 2020-09-13 DIAGNOSIS — I10 Essential (primary) hypertension: Secondary | ICD-10-CM | POA: Insufficient documentation

## 2020-09-13 DIAGNOSIS — R519 Headache, unspecified: Secondary | ICD-10-CM | POA: Diagnosis not present

## 2020-09-13 DIAGNOSIS — Z79899 Other long term (current) drug therapy: Secondary | ICD-10-CM | POA: Diagnosis not present

## 2020-09-13 DIAGNOSIS — Z7982 Long term (current) use of aspirin: Secondary | ICD-10-CM | POA: Diagnosis not present

## 2020-09-13 DIAGNOSIS — M542 Cervicalgia: Secondary | ICD-10-CM | POA: Diagnosis not present
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
## 2020-09-13 NOTE — ED Notes (Signed)
Holly Quest, NP at bedside.

## 2020-09-13 NOTE — Discharge Instructions (Addendum)
Please refer to attached instructions. Tylenol or ibuprofen for discomfort. Expect soreness for the next several days. Follow-up with your primary care provider if symptoms do not improve.
